# Patient Record
Sex: Male | Born: 1970 | Race: Black or African American | Hispanic: No | Marital: Single | Smoking: Former smoker
Health system: Southern US, Community
[De-identification: ages and names within clinical notes are randomized; demographics above are authoritative.]

## PROBLEM LIST (undated history)

## (undated) DIAGNOSIS — E119 Type 2 diabetes mellitus without complications: Secondary | ICD-10-CM

## (undated) DIAGNOSIS — E785 Hyperlipidemia, unspecified: Secondary | ICD-10-CM

## (undated) DIAGNOSIS — D1720 Benign lipomatous neoplasm of skin and subcutaneous tissue of unspecified limb: Secondary | ICD-10-CM

## (undated) HISTORY — PX: NASAL FRACTURE SURGERY: SHX718

---

## 2005-05-21 ENCOUNTER — Emergency Department (HOSPITAL_COMMUNITY): Admission: EM | Admit: 2005-05-21 | Discharge: 2005-05-21 | Payer: Self-pay | Admitting: Family Medicine

## 2006-04-29 ENCOUNTER — Emergency Department (HOSPITAL_COMMUNITY): Admission: EM | Admit: 2006-04-29 | Discharge: 2006-04-29 | Payer: Self-pay | Admitting: Family Medicine

## 2007-08-24 ENCOUNTER — Emergency Department (HOSPITAL_COMMUNITY): Admission: EM | Admit: 2007-08-24 | Discharge: 2007-08-24 | Payer: Self-pay | Admitting: Emergency Medicine

## 2013-09-22 ENCOUNTER — Emergency Department (HOSPITAL_COMMUNITY): Payer: No Typology Code available for payment source

## 2013-09-22 ENCOUNTER — Encounter (HOSPITAL_COMMUNITY): Payer: Self-pay | Admitting: Emergency Medicine

## 2013-09-22 ENCOUNTER — Emergency Department (INDEPENDENT_AMBULATORY_CARE_PROVIDER_SITE_OTHER)
Admission: EM | Admit: 2013-09-22 | Discharge: 2013-09-22 | Disposition: A | Discharge status: Hospice | Payer: PRIVATE HEALTH INSURANCE | Source: Home / Self Care | Attending: Family Medicine | Admitting: Family Medicine

## 2013-09-22 ENCOUNTER — Emergency Department (HOSPITAL_COMMUNITY)
Admission: EM | Admit: 2013-09-22 | Discharge: 2013-09-22 | Disposition: A | Discharge status: Home / Self Care | Payer: No Typology Code available for payment source | Attending: Emergency Medicine | Admitting: Emergency Medicine

## 2013-09-22 DIAGNOSIS — Z87891 Personal history of nicotine dependence: Secondary | ICD-10-CM | POA: Insufficient documentation

## 2013-09-22 DIAGNOSIS — R079 Chest pain, unspecified: Secondary | ICD-10-CM

## 2013-09-22 DIAGNOSIS — I10 Essential (primary) hypertension: Secondary | ICD-10-CM

## 2013-09-22 LAB — CBC WITH DIFFERENTIAL/PLATELET
BASOS ABS: 0 10*3/uL (ref 0.0–0.1)
BASOS PCT: 0 % (ref 0–1)
EOS ABS: 0.1 10*3/uL (ref 0.0–0.7)
EOS PCT: 1 % (ref 0–5)
HCT: 38.6 % — ABNORMAL LOW (ref 39.0–52.0)
Hemoglobin: 13.2 g/dL (ref 13.0–17.0)
Lymphocytes Relative: 23 % (ref 12–46)
Lymphs Abs: 2.1 10*3/uL (ref 0.7–4.0)
MCH: 33.2 pg (ref 26.0–34.0)
MCHC: 34.2 g/dL (ref 30.0–36.0)
MCV: 97 fL (ref 78.0–100.0)
Monocytes Absolute: 0.7 10*3/uL (ref 0.1–1.0)
Monocytes Relative: 8 % (ref 3–12)
Neutro Abs: 6.2 10*3/uL (ref 1.7–7.7)
Neutrophils Relative %: 68 % (ref 43–77)
PLATELETS: 227 10*3/uL (ref 150–400)
RBC: 3.98 MIL/uL — ABNORMAL LOW (ref 4.22–5.81)
RDW: 11.8 % (ref 11.5–15.5)
WBC: 9.2 10*3/uL (ref 4.0–10.5)

## 2013-09-22 LAB — I-STAT TROPONIN, ED
Troponin i, poc: 0 ng/mL (ref 0.00–0.08)
Troponin i, poc: 0 ng/mL (ref 0.00–0.08)

## 2013-09-22 LAB — COMPREHENSIVE METABOLIC PANEL
ALT: 25 U/L (ref 0–53)
ANION GAP: 13 (ref 5–15)
AST: 21 U/L (ref 0–37)
Albumin: 3.6 g/dL (ref 3.5–5.2)
Alkaline Phosphatase: 67 U/L (ref 39–117)
BILIRUBIN TOTAL: 0.4 mg/dL (ref 0.3–1.2)
BUN: 10 mg/dL (ref 6–23)
CO2: 29 mEq/L (ref 19–32)
CREATININE: 1.06 mg/dL (ref 0.50–1.35)
Calcium: 9.2 mg/dL (ref 8.4–10.5)
Chloride: 100 mEq/L (ref 96–112)
GFR calc non Af Amer: 86 mL/min — ABNORMAL LOW (ref >90)
Glucose, Bld: 120 mg/dL — ABNORMAL HIGH (ref 70–99)
Potassium: 4.2 mEq/L (ref 3.7–5.3)
Sodium: 142 mEq/L (ref 137–147)
TOTAL PROTEIN: 7.5 g/dL (ref 6.0–8.3)

## 2013-09-22 LAB — D-DIMER, QUANTITATIVE (NOT AT ARMC): D DIMER QUANT: 0.44 ug{FEU}/mL (ref 0.00–0.48)

## 2013-09-22 MED ORDER — ASPIRIN 81 MG PO CHEW
CHEWABLE_TABLET | ORAL | Status: AC
  Filled 2013-09-22: qty 4

## 2013-09-22 MED ORDER — ALBUTEROL SULFATE (2.5 MG/3ML) 0.083% IN NEBU
5.0000 mg | INHALATION_SOLUTION | Freq: Once | RESPIRATORY_TRACT | Status: AC
  Administered 2013-09-22: 5 mg via RESPIRATORY_TRACT
  Filled 2013-09-22: qty 6

## 2013-09-22 MED ORDER — SODIUM CHLORIDE 0.9 % IV SOLN
Freq: Once | INTRAVENOUS | Status: AC
  Administered 2013-09-22: 18:00:00 via INTRAVENOUS

## 2013-09-22 MED ORDER — NITROGLYCERIN 0.4 MG SL SUBL
0.4000 mg | SUBLINGUAL_TABLET | SUBLINGUAL | Status: DC | PRN
  Administered 2013-09-22: 0.4 mg via SUBLINGUAL

## 2013-09-22 MED ORDER — MORPHINE SULFATE 4 MG/ML IJ SOLN
4.0000 mg | Freq: Once | INTRAMUSCULAR | Status: DC
  Filled 2013-09-22: qty 1

## 2013-09-22 MED ORDER — ASPIRIN 81 MG PO CHEW
324.0000 mg | CHEWABLE_TABLET | Freq: Once | ORAL | Status: AC
  Administered 2013-09-22: 324 mg via ORAL

## 2013-09-22 MED ORDER — NITROGLYCERIN 0.4 MG SL SUBL
SUBLINGUAL_TABLET | SUBLINGUAL | Status: AC
  Filled 2013-09-22: qty 1

## 2013-09-22 MED ORDER — ASPIRIN 81 MG PO CHEW
CHEWABLE_TABLET | ORAL | Status: AC
  Filled 2013-09-22: qty 1

## 2013-09-22 NOTE — ED Notes (Signed)
Pt ambulatory to bathroom with steady gait.

## 2013-09-22 NOTE — ED Notes (Signed)
Pt arrived by [facility] from [facility] with c/o CP that is in the center. CP woke pt up from a nap around 1530. Denies any nausea or dizziness. Does have some SOB with CP. UC administered ASA 324mg  and Nitro x1 then EMS administered Nitro x1. Pain has increased since first Nitro from 3/10 to 6/10. Currently pain is 5/10. NSR on monitor.

## 2013-09-22 NOTE — Discharge Instructions (Signed)
Your blood sugar is slightly elevated. Please follow up regarding this.   You may need a stress test. [facility].   Follow up with a doctor.   Return to ER if you have severe chest pain, shortness of breath.    Emergency Department Resource Guide 1) Find a Doctor and Pay Out of Pocket Although you won't have to find out who is covered by your insurance plan, it is a good idea to ask around and get recommendations. You will then need to call the office and see if the doctor you have chosen will accept you as a new patient and what types of options they offer for patients who are self-pay. Some doctors offer discounts or will set up payment plans for their patients who do not have insurance, but you will need to ask so you aren't surprised when you get to your appointment.  2) Contact Your Local Health Department Not all health departments have doctors that can see patients for sick visits, but many do, so it is worth a call to see if yours does. If you don't know where your local health department is, you can check in your phone book. The CDC also has a tool to help you locate your state's health department, and many state websites also have listings of all of their local health departments.  3) Find a [facility] If your illness is not likely to be very severe or complicated, you may want to try a walk in clinic. These are popping up all over the country in pharmacies, drugstores, and shopping centers. They're usually staffed by nurse practitioners or physician assistants that have been trained to treat common illnesses and complaints. They're usually fairly quick and inexpensive. However, if you have serious medical issues or chronic medical problems, these are probably not your best option.  No Primary Care Doctor: - Call Health Connect at  [phone] - they can help you locate a primary care doctor that  accepts your insurance, provides certain services, etc. - Physician Referral  Service- [phone]  Chronic Pain Problems: Organization         Address  Phone   Notes  [facility]  [phone] Patients need to be referred by their primary care doctor.   Medication Assistance: Organization         Address  Phone   Notes  [facility] Medication [facility] [address] [place], [place] [address] [phone] --Must be a resident of [facility] -- Must have NO insurance coverage whatsoever (no Medicaid/ Medicare, etc.) -- The pt. MUST have a primary care doctor that directs their care regularly and follows them in the community   [facility]ist  [phone]   [employer]  [phone]    Agencies that provide inexpensive medical care: Organization         Address  Phone   Notes  [place]  [phone]   [facility]    [phone]   [facility] [place], [place] [address] [phone]   [place] [address], [place] [phone]   Planned Parenthood    [phone]   [facility]    [phone]   [place] and [place] [address], [place] Phone:  [phone], Fax:  [phone] Hours of Operation:  9 am - 6 pm, M-F.  Also accepts Medicaid/Medicare and self-pay.  [facility] for [place] [address], [address], [place] Phone: [phone], Fax: [phone]. Hours of Operation:  8:30 am - 5:30 pm, M-F.  Also accepts Medicaid and self-pay.  [facility] [place] [address], [place] Phone: [phone]   [place], [place], [place] [phone], Ext. [phone] Mondays & Thursdays: 7-9 AM.  First 15 patients are seen on a first come, first serve basis.    [place] Providers:  Organization         Address  Phone   Notes  [facility] [address], [place] [phone] Also accepts self-pay patients.  [facility] [address], [place]  [phone]   [place], [address], [place] [phone]   [facility] [address] [place] [phone]   Lucianne Lei [address], [place]   [phone] Only accepts [place] Access [place] patients after they have their name applied to their card.   Self-Pay (no insurance) in [facility]:  Organization         Address  Phone   Notes  Sickle Cell Patients, [facility] [place] [phone]   [facility] [place] [phone]   [facility] [place]  [place], [place], [place] [phone]   [facility]/Dr. Osei-Bonsu  [address] [place] or [address], [place] [phone] Phone number for both [place] and [place] locations is the same.  Urgent Medical and [facility] [address] [place] [phone]   [facility] [address] [place] or [address] [phone]   [facility] [address], [place] [phone], phone; [phone], fax Sees patients 1st and 3rd Saturday of every month.  Must not qualify for public or private insurance (i.e. Medicaid, Medicare, [place] Health Choice, Veterans' Benefits)  Household income should be no more than 200% of the poverty level The clinic cannot treat you if you are pregnant or think you are pregnant  Sexually transmitted diseases are not treated at the clinic.    Dental Care: Organization         Address  Phone  Notes  [facility] Department of [facility] [place] [phone] Accepts children up to age 47 who are enrolled in  [place] or [place]; pregnant women with a Medicaid card; and children who have applied for Medicaid or [place] Health Choice, but were declined, whose parents can pay a reduced fee at time of service.  [facility] Department of [facility]  [address], [place] [phone] Accepts children up to age 94 who are enrolled in [place] or [place]; pregnant women with a Medicaid card; and children who have applied for Medicaid or [place] Health Choice, but were declined, whose parents can pay a reduced fee at time of service.  [place] Adult Dental Access PROGRAM  [place] [phone] Patients are seen by appointment only. Walk-ins are not accepted. [place] will see patients 76 years of age and older. Monday - Tuesday (  8am-5pm) Most Wednesdays (8:30-5pm) $30 per visit, cash only  [facility] Adult Dental Access PROGRAM  [address], [facility] [phone] Patients are seen by appointment only. Walk-ins are not accepted. [place] will see patients 1 years of age and older. One Wednesday Evening (Monthly: Volunteer Based).  $30 per visit, cash only  [place]  [phone] for adults; Children under age 44, call Graduate Pediatric Dentistry at [phone]. Children aged 74-14, please call [phone] to request a pediatric application.  Dental services are provided in all areas of dental care including fillings, crowns and bridges, complete and partial dentures, implants, gum treatment, root canals, and extractions. Preventive care is also provided. Treatment is provided to both adults and children. Patients are selected via a lottery and there is often a waiting list.   [facility] [address] [place]  [phone] [email or web address]   [facility] [address] [place], [place] [phone], Ext. [phone] Second and Fourth Thursday of each month, opens at 6:30  AM; Clinic ends at 9 AM.  Patients are seen on a first-come first-served basis, and a limited number are seen during each clinic.   [facility]  [address] [place]ard [place], [place] [phone]   Eligibility Requirements You must have lived in [place], [place], or [place] counties for at least the last three months.   You cannot be eligible for state or federal sponsored [employer], including [employer], [place], or [employer].   You generally cannot be eligible for healthcare insurance through your employer.    How to apply: Eligibility screenings are held every Tuesday and Wednesday afternoon from 1:00 pm until 4:00 pm. You do not need an appointment for the interview!  [facility] [address] [place], [place], [place]   [place]  [place] Department  [place]  [phone]    Behavioral Health Resources in the Community: Intensive Outpatient Programs Organization         Address  Phone  Notes  [place]. [address], [place], [place] [phone]   [facility] Outpatient [address], [place], [place]   ADS: Alcohol & Drug Svcs [address] [place], [place]   [place] [address],  [place], [place] or [phone]   Substance Abuse Resources Organization         Address  Phone  Notes  Alcohol and Drug Services  [phone]   [place]  [phone]   [place]   Chinita Pester  [phone]   Residential & Outpatient Substance Abuse Program  [phone]   Psychological Services Organization         Address  Phone  Notes  [facility] [place]  [place]  [phone]   [place] [address] [place] or  [phone]    Mobile Crisis Teams Organization         Address  Phone  Notes  [facility], Mobile Crisis Care Unit  [phone]   [facility] Psych[facility] Services  [address] [place], [place]   [place] [address] [place] [phone]    Self-Help/Support Groups Organization         Address  Phone  Notes  Mental Health Assoc. of [place] - variety of support groups  [place] Call for more information  Narcotics Anonymous (NA), [facility] [address], [employer] [place]  2 meetings at this location   Special educational needs teacher         Address  Phone  Notes  [facility]idential [facility] [place],    [place]  [phone]   [facility]  [address] [place] [phone], [place], [place]   [place], [place] [phone] Admissions: 8am-3pm M-F  Incentives Substance [place] [address]    [place], [place] [phone]   [facility] [address] [place], [place], [place]   [facility] [address],  [place], [place]   [facility] Programs - Intensive Outpatient [address], [place], [place]   [facility] ([place] [address]  [place], [place] [phone] or [phone]   [facility]idential Treatment Services (RTS) [address] [place], [place] Accepts Medicaid  [facility] [place] [address],  [place] [phone] Substance Abuse/Addiction Treatment   [facility] Organization         Address  Phone  Notes  [facility]  [phone]   Domenic Schwab, PhD [address] Arlis [place], [place]   [phone] or [phone]   [place] and [place], [place] [phone]   [facility] [address],  [place], [place] [phone] Insurance/Medicaid/sponsorship through [facility] and Families [address], [address] [place]                                    [place], [place] [phone] [place] [address] [place], [place] [phone]    Dr. Adele Schilder  [phone]   [facility] Dept. 1) [address], [place] 2) [place] 3)  [place] [address], [place] [phone]  [phone]   [place] [phone] or [phone] (After Hours)

## 2013-09-22 NOTE — ED Provider Notes (Signed)
Allen Weber is a 43 y.o. male who presents to Urgent Care today for chest pain. Patient developed acute onset of central sharp exertional chest pain. This occurred about 3 hours ago. This is also associated with mild shortness of breath. He denies any palpitations lightheadedness or dizziness. He additionally notes a large mass in his left axilla that has been present for 4-5 years. He denies any medical problems but does not go to doctors.   History reviewed. No pertinent past medical history. History  Substance Use Topics  . Smoking status: Not on file  . Smokeless tobacco: Not on file  . Alcohol Use: Not on file   ROS as above Medications: Current Facility-Administered Medications  Medication Dose Route Frequency Provider Last Rate Last Dose  . 0.9 %  sodium chloride infusion   Intravenous Once Gregor Hams, MD      . aspirin chewable tablet 324 mg  324 mg Oral Once Gregor Hams, MD      . nitroGLYCERIN (NITROSTAT) SL tablet 0.4 mg  0.4 mg Sublingual Q5 min PRN Gregor Hams, MD       No current outpatient prescriptions on file.    Exam:  BP 172/70  Pulse 105  Temp(Src) 98.4 F (36.9 C) (Oral)  Resp 18  SpO2 95% Gen: Well NAD HEENT: EOMI,  MMM Lungs: Normal work of breathing. CTABL Heart: Tachycardia but regular no MRG Abd: NABS, Soft. NT, ND Exts: Brisk capillary refill, warm and well perfused.  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 T wave flattening of the lateral precordial leads associated with 1 mm ST elevation of leads V 5 and 6 as well as aVF. This may reflect early repolarization with increased J-point elevation.  No results found for this or any previous visit (from the past 24 hour(s)). No results found.  Assessment and Plan: 43 y.o. male with acute onset of central exertional chest pain with associated mild EKG abnormalities. Additionally patient  has  decreased oxygen saturation and tachycardia. This is concerning for acute coronary syndrome or pulmonary embolism.  Plan to provide oxygen nitroglycerin aspirin and started IV. Transfer to the emergency room via EMS.  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  Discussed warning signs or symptoms. Please see discharge instructions. Patient expresses understanding.    Gregor Hams, MD 09/22/13 [phone]

## 2013-09-22 NOTE — ED Provider Notes (Signed)
CSN: [id number]     Arrival date & time 09/22/13  1820 History   First MD Initiated Contact with Patient 09/22/13 1856     Chief Complaint  Patient presents with  . Chest Pain     (Consider location/radiation/quality/duration/timing/severity/associated sxs/prior Treatment) The history is provided by the patient.  Allen Weber is a 43 y.o. male here with chest pain. Hasn't seen a doctor for several years. Woke up around 3:30 pm with chest pain. Substernal chest pain, mild shortness of breath. He said that it is sometimes worse with deep breathing and sometimes exertional but not all the time. No hx of CAD. Also has L axillary mass that is chronic and not draining. Went to urgent care, sent for evaluation of chest pain. 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    History reviewed. No pertinent past medical history. History reviewed. No pertinent past surgical history. No family history on file. History  Substance Use Topics  . Smoking status: Former Smoker    Types: Cigarettes    Quit date: 07/23/2013  . Smokeless tobacco: Not on file  . Alcohol Use: Yes     Comment: occasionally    Review of Systems  Cardiovascular: Positive for chest pain.  All other systems reviewed and are negative.     Allergies  Review of patient's allergies indicates no known allergies.  Home Medications   Prior to Admission medications   Medication Sig Start Date End Date Taking? Authorizing Provider  ibuprofen (ADVIL,MOTRIN) 200 MG tablet Take 400 mg by mouth every 6 (six) hours as needed.   Yes Historical Provider, MD   BP 113/67  Pulse 86  Temp(Src) 98.6 F (37 C) (Oral)  Resp 21  Wt 204 lb 9 oz (92.789 kg)  SpO2 98% Physical Exam  Nursing note and vitals reviewed. Constitutional: He is oriented to person, place, and time. He appears well-developed and well-nourished.  Slightly uncomfortable   HENT:  Head: Normocephalic.  Mouth/Throat: Oropharynx is clear and moist.  Eyes: Conjunctivae  are normal. Pupils are equal, round, and reactive to light.  Neck: Normal range of motion. Neck supple.  Cardiovascular: Normal rate, regular rhythm and normal heart sounds.   Pulmonary/Chest: Effort normal and breath sounds normal. No respiratory distress. He has no wheezes. He has no rales. He exhibits no tenderness.  Abdominal: Soft. Bowel sounds are normal. He exhibits no distension. There is no tenderness. There is no rebound and no guarding.  Musculoskeletal: Normal range of motion. He exhibits no edema and no tenderness.  Neurological: He is alert and oriented to person, place, and time. No cranial nerve deficit. Coordination normal.  Skin: Skin is warm and dry.  L axilla with lipoma with no fluctuance   Psychiatric: He has a normal mood and affect. His behavior is normal. Judgment and thought content normal.    ED Course  Procedures (including critical care time) Labs Review Labs Reviewed  CBC WITH DIFFERENTIAL - Abnormal; Notable for the following:    RBC 3.98 (*)    HCT 38.6 (*)    All other components within normal limits  COMPREHENSIVE METABOLIC PANEL - Abnormal; Notable for the following:    Glucose, Bld 120 (*)    GFR calc non Af Amer 86 (*)    All other components within normal limits  D-DIMER, QUANTITATIVE  I-STAT TROPOININ, ED  Randolm Idol, ED    Imaging Review Dg Chest 2 View  09/22/2013   CLINICAL DATA:  Chest pain.  EXAM: CHEST  2 VIEW  COMPARISON:  None.  FINDINGS: Heart size and mediastinal contours are within normal limits. Both lungs are clear. Visualized skeletal structures are unremarkable.  IMPRESSION: Negative exam.   Electronically Signed   By: Inge Rise M.D.   On: 09/22/2013 19:35     EKG Interpretation   Date/Time:  Sunday September 22 2013 21:44:16 EDT Ventricular Rate:  80 PR Interval:  172 QRS Duration: 89 QT Interval:  346 QTC Calculation: 399 R Axis:   48 Text Interpretation:  Sinus rhythm ST elev, probable normal early repol   pattern No significant change since last tracing earlier that day   Confirmed by Lamonda Noxon  MD, Iyona Pehrson ([id number]) on 09/22/2013 9:55:41 PM      MDM   Final diagnoses:  None   Allen Weber is a 43 y.o. male here with chest pain. Consider ACS vs PE given tachycardia. Will get trop x 2 and d-dimer. Will reasess.   10:25 PM Trop neg x 2. D-dimer neg. Pain free. Repeat EKG unchanged. Will refer to Wellness for follow up.    Wandra Arthurs, MD 09/22/13 2225

## 2013-09-22 NOTE — ED Notes (Signed)
C/o mid chest pain which started today  States he has had this mass under his left arm for 4-5 years

## 2013-09-25 ENCOUNTER — Ambulatory Visit: Payer: No Typology Code available for payment source | Attending: Internal Medicine | Admitting: Internal Medicine

## 2013-09-25 VITALS — BP 133/84 | HR 80 | Temp 98.2°F | Resp 16 | Ht 74.5 in | Wt 252.0 lb

## 2013-09-25 DIAGNOSIS — I10 Essential (primary) hypertension: Secondary | ICD-10-CM

## 2013-09-25 DIAGNOSIS — R079 Chest pain, unspecified: Secondary | ICD-10-CM | POA: Diagnosis not present

## 2013-09-25 NOTE — Progress Notes (Signed)
Pt is here to establish care. Pt had a heart attack last Sunday. Pt states that he is still feeling weak. Pt has a mass under his left arm pit.

## 2013-10-05 NOTE — Progress Notes (Signed)
   Subjective:    Patient ID: Allen Weber, male    DOB: 10-Apr-1970, 43 y.o.   MRN: [id number]  HPI  Pt is 43 yo male who presents to clinic for follow up. Denies chest pain and shortness of breath, needs refill on medications.   Review of Systems Constitutional: Negative for fever, chills, diaphoresis, activity change, appetite change and fatigue.  HENT: Negative for ear pain, nosebleeds, congestion, facial swelling, rhinorrhea, neck pain, neck stiffness and ear discharge.   Eyes: Negative for pain, discharge, redness, itching and visual disturbance.  Respiratory: Negative for cough, choking, chest tightness, shortness of breath, wheezing and stridor.   Cardiovascular: Negative for chest pain, palpitations and leg swelling.  Gastrointestinal: Negative for abdominal distention.  Genitourinary: Negative for dysuria, urgency, frequency, hematuria, flank pain, decreased urine volume, difficulty urinating and dyspareunia.  Musculoskeletal: Negative for back pain, joint swelling, arthralgias and gait problem.  Neurological: Negative for dizziness, tremors, seizures, syncope, facial asymmetry, speech difficulty, weakness, light-headedness, numbness and headaches.  Hematological: Negative for adenopathy. Does not bruise/bleed easily.  Psychiatric/Behavioral: Negative for hallucinations, behavioral problems, confusion, dysphoric mood, decreased concentration and agitation.        Objective:   Physical Exam Constitutional: Appears well-developed and well-nourished. No distress.  HENT: Normocephalic. External right and left ear normal. Oropharynx is clear and moist.  Eyes: Conjunctivae and EOM are normal. PERRLA, no scleral icterus.  Neck: Normal ROM. Neck supple. No JVD. No tracheal deviation. No thyromegaly.  CVS: RRR, S1/S2 +, no murmurs, no gallops, no carotid bruit.  Pulmonary: Effort and breath sounds normal, no stridor, rhonchi, wheezes, rales.  Axilla: left axilla with mass, non TTP,  no erythema       Assessment & Plan:  HTN  - stable  - continue to monitor - pt advised to call clinic if BP > 140/90 Chest pain - resolved  - referral to cardiology for consideration of stress test  Left axillary mass - referral for surgery for further evaluation

## 2013-10-06 ENCOUNTER — Emergency Department: Payer: Self-pay | Admitting: Emergency Medicine

## 2013-10-06 LAB — URINALYSIS, COMPLETE
BACTERIA: NONE SEEN
BLOOD: NEGATIVE
Bilirubin,UR: NEGATIVE
Glucose,UR: NEGATIVE mg/dL (ref 0–75)
KETONE: NEGATIVE
Leukocyte Esterase: NEGATIVE
NITRITE: NEGATIVE
PH: 5 (ref 4.5–8.0)
Protein: NEGATIVE
RBC,UR: 1 /HPF (ref 0–5)
Specific Gravity: 1.023 (ref 1.003–1.030)
Squamous Epithelial: <1
WBC UR: 1 /HPF (ref 0–5)

## 2013-10-06 LAB — GC/CHLAMYDIA PROBE AMP

## 2013-10-15 ENCOUNTER — Encounter (INDEPENDENT_AMBULATORY_CARE_PROVIDER_SITE_OTHER): Payer: Self-pay | Admitting: General Surgery

## 2013-10-15 ENCOUNTER — Ambulatory Visit (INDEPENDENT_AMBULATORY_CARE_PROVIDER_SITE_OTHER): Payer: No Typology Code available for payment source | Admitting: General Surgery

## 2013-10-15 VITALS — BP 122/88 | HR 90 | Temp 97.3°F | Ht 74.0 in | Wt 250.0 lb

## 2013-10-15 DIAGNOSIS — D1739 Benign lipomatous neoplasm of skin and subcutaneous tissue of other sites: Secondary | ICD-10-CM

## 2013-10-15 DIAGNOSIS — D1720 Benign lipomatous neoplasm of skin and subcutaneous tissue of unspecified limb: Secondary | ICD-10-CM | POA: Insufficient documentation

## 2013-10-15 NOTE — Progress Notes (Signed)
Patient ID: Allen Weber, male   DOB: September 10, 1970, 43 y.o.   MRN: [id number]  Chief Complaint  Patient presents with  . left axillary lump    HPI Allen Weber is a 43 y.o. male.  He is referred by Dr. Mart Piggs at the [facility] for a left axillary mass.   The patient is single. He works third shift at a [employer]. He states that he has had a slowly enlarging mass in his left axilla for 5 years. It now is creating a significant mass effect and this bothers him. It has never been infected or drained. It is not tender.  Recent workup at urgent care for chest pain was negative. Hemoglobin 13.2. C-met normal. Chest x-ray normal. Otherwise healthy. On no medications. Weight has been stable.  Family history is negative for lymphoma or leukemia. Positive family history for diabetes. HPI  History reviewed. No pertinent past medical history.  History reviewed. No pertinent past surgical history.  Family History  Problem Relation Age of Onset  . Hypertension Mother   . Diabetes Maternal Grandmother   . Hypertension Maternal Grandmother   . Diabetes Paternal Grandmother   . Hypertension Paternal Grandmother     Social History History  Substance Use Topics  . Smoking status: Former Smoker    Types: Cigarettes    Quit date: 07/23/2013  . Smokeless tobacco: Not on file  . Alcohol Use: Yes     Comment: occasionally    No Known Allergies  Current Outpatient Prescriptions  Medication Sig Dispense Refill  . ibuprofen (ADVIL,MOTRIN) 200 MG tablet Take 400 mg by mouth every 6 (six) hours as needed.       No current facility-administered medications for this visit.    Review of Systems Review of Systems  Constitutional: Negative for fever, chills and unexpected weight change.  HENT: Negative for congestion, hearing loss, sore throat, trouble swallowing and voice change.   Eyes: Negative for visual disturbance.  Respiratory: Negative for  cough and wheezing.   Cardiovascular: Negative for chest pain, palpitations and leg swelling.  Gastrointestinal: Negative for nausea, vomiting, abdominal pain, diarrhea, constipation, blood in stool, abdominal distention, anal bleeding and rectal pain.  Genitourinary: Negative for hematuria and difficulty urinating.  Musculoskeletal: Negative for arthralgias.  Skin: Negative for rash and wound.  Neurological: Negative for seizures, syncope, weakness and headaches.  Hematological: Negative for adenopathy. Does not bruise/bleed easily.  Psychiatric/Behavioral: Negative for confusion.    Blood pressure 122/88, pulse 90, temperature 97.3 F (36.3 C), height _0  (1.88 m), weight 250 lb (113.399 kg).  Physical Exam Physical Exam  Constitutional: He is oriented to person, place, and time. He appears well-developed and well-nourished. No distress.  HENT:  Head: Normocephalic.  Nose: Nose normal.  Mouth/Throat: No oropharyngeal exudate.  Eyes: Conjunctivae and EOM are normal. Pupils are equal, round, and reactive to light. Right eye exhibits no discharge. Left eye exhibits no discharge. No scleral icterus.  Neck: Normal range of motion. Neck supple. No JVD present. No tracheal deviation present. No thyromegaly present.  Cardiovascular: Normal rate, regular rhythm, normal heart sounds and intact distal pulses.   No murmur heard. Pulmonary/Chest: Effort normal and breath sounds normal. No stridor. No respiratory distress. He has no wheezes. He has no rales. He exhibits no tenderness.  Right axilla normal. Left axilla reveals a large protuberant, semi-pedunculated mass low in the axilla. This feels like a very large lipoma, about 8 cm x 4 cm in  size. Nontender. No erythema. Less likely ectopic breast tissue. No axillary adenopathy.  Abdominal: Soft. Bowel sounds are normal. He exhibits no distension and no mass. There is no tenderness. There is no rebound and no guarding.  Genitourinary:  No  inguinal adenopathy.  Musculoskeletal: Normal range of motion. He exhibits no edema and no tenderness.  Lymphadenopathy:    He has no cervical adenopathy.  Neurological: He is alert and oriented to person, place, and time. He has normal reflexes. Coordination normal.  Skin: Skin is warm and dry. No rash noted. He is not diaphoretic. No erythema. No pallor.  Psychiatric: He has a normal mood and affect. His behavior is normal. Judgment and thought content normal.  Pleasant and cooperative. Very flattened affect.    Data Reviewed Office notes from [facility]. Lab work from [facility].  Assessment    8 cm pedunculated left axillary mass. Lipoma likely. Ectopic breast tissue less likely. Solid, not cystic. Doubt pathologic adenopathy     Plan    I discussed the differential diagnosis with the patient. I offered to excise this. I told him that this would probably have to be excised including skin and the deeper tissues, possibly with a drain but hopefully not. We talked about back to work issues and temporary disability. Talked about risks and complications.  At the end of the discussion he wanted to go home and think about this. He will call me back if he decides to have this removed.        Debby Clyne M 10/15/2013, 4:38 PM

## 2013-10-15 NOTE — Patient Instructions (Signed)
There is a large, protuberant mass in your left axilla. You stated that this has been enlarging for the past 5 years  This is most likely a benign lipoma. Less likely this may be ectopic breast tissue. It does not feel like a cancer.  The best way to handle this is an outpatient operation to excise this area as we discussed.  You stated that you will think about this and call me back when you are ready to do the surgery.

## 2013-10-23 ENCOUNTER — Emergency Department: Payer: Self-pay | Admitting: Emergency Medicine

## 2013-10-23 ENCOUNTER — Ambulatory Visit: Payer: PRIVATE HEALTH INSURANCE

## 2013-11-04 ENCOUNTER — Institutional Professional Consult (permissible substitution): Payer: PRIVATE HEALTH INSURANCE | Admitting: Cardiovascular Disease

## 2013-12-04 ENCOUNTER — Institutional Professional Consult (permissible substitution): Payer: PRIVATE HEALTH INSURANCE | Admitting: Cardiovascular Disease

## 2014-01-09 ENCOUNTER — Institutional Professional Consult (permissible substitution): Payer: PRIVATE HEALTH INSURANCE | Admitting: Cardiovascular Disease

## 2015-09-17 IMAGING — CR DG CHEST 2V
2 series · 2 of 2 positions shown · non-contrast
Comparison: None.

CLINICAL DATA: Chest pain.

EXAM:
CHEST  2 VIEW

[w chest pa]
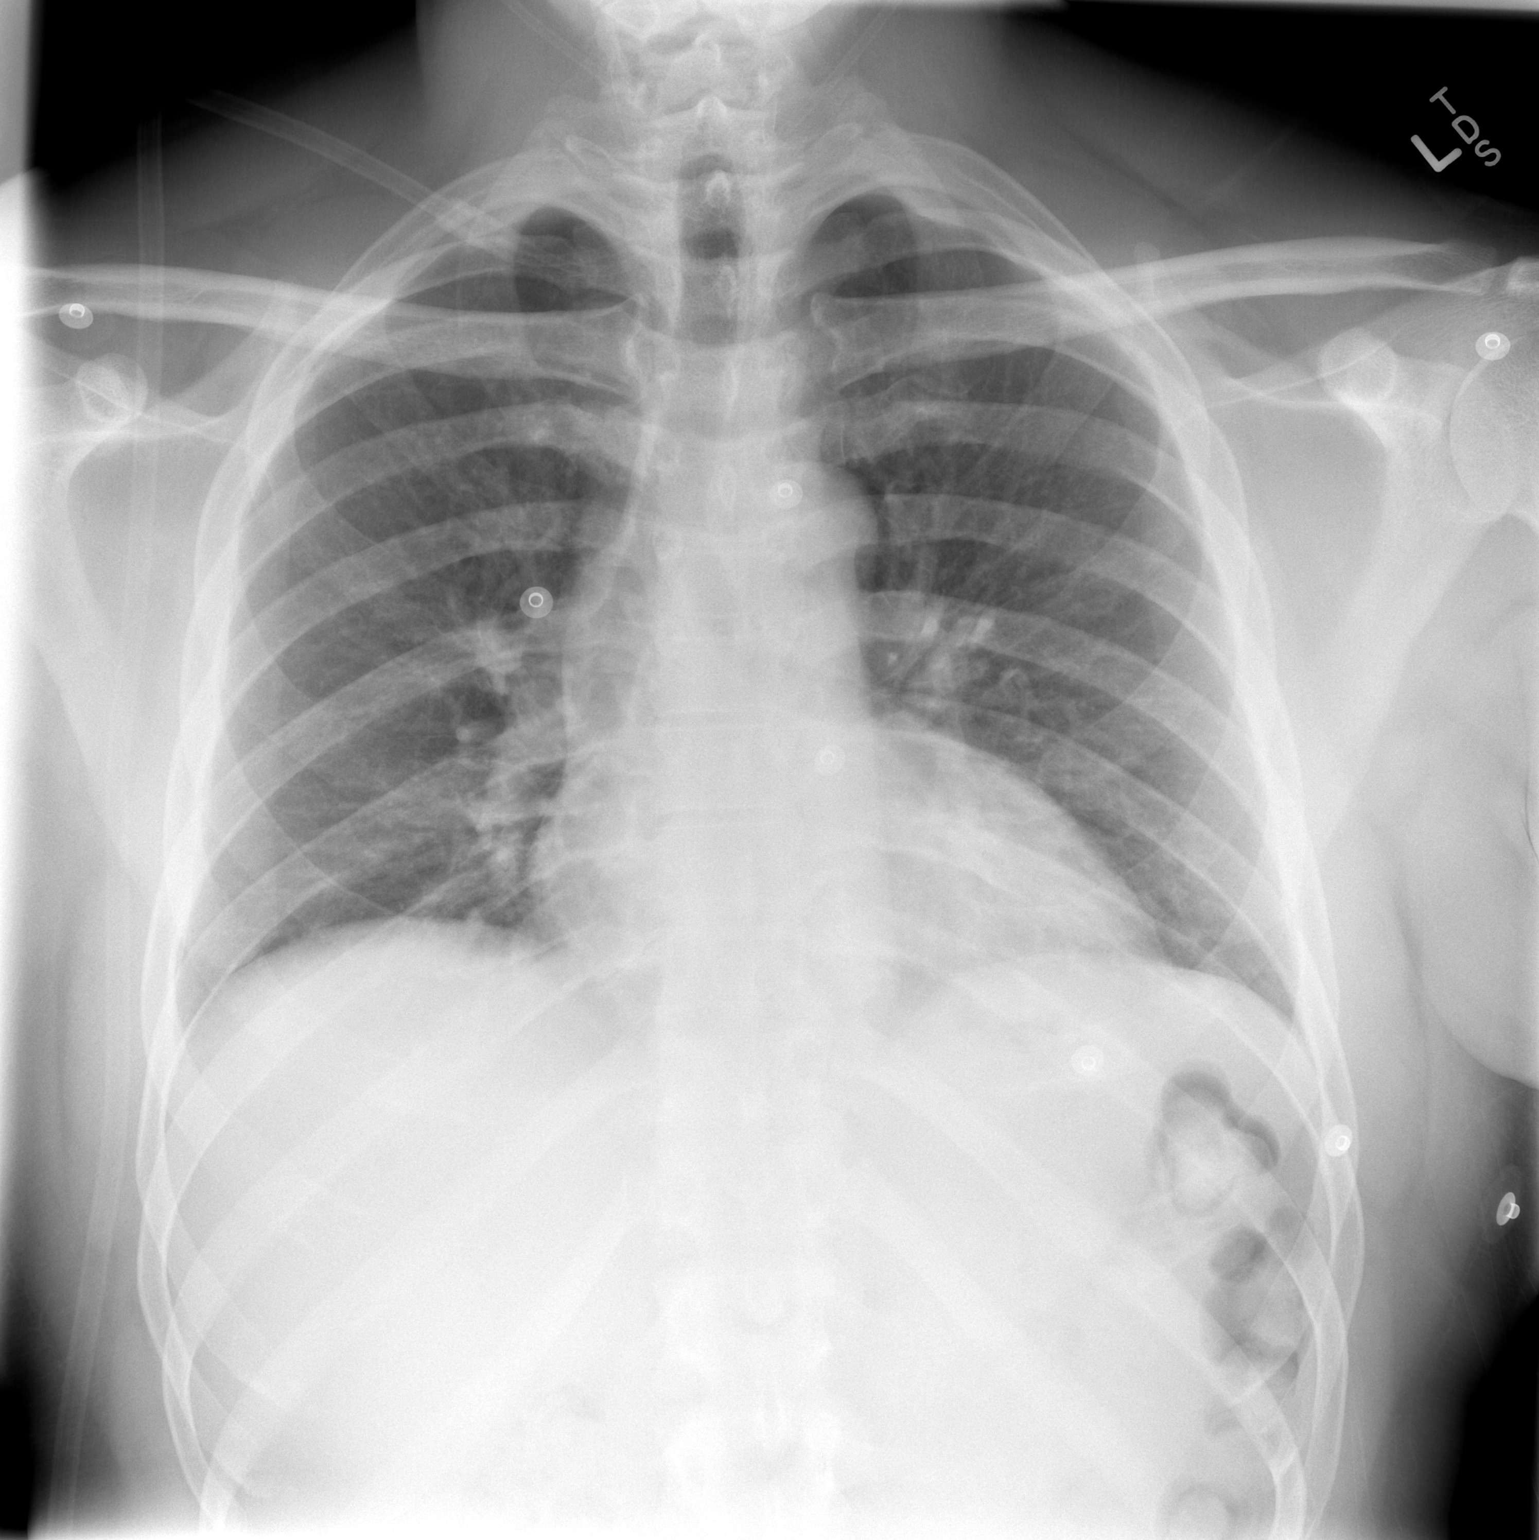

[w chest lat]
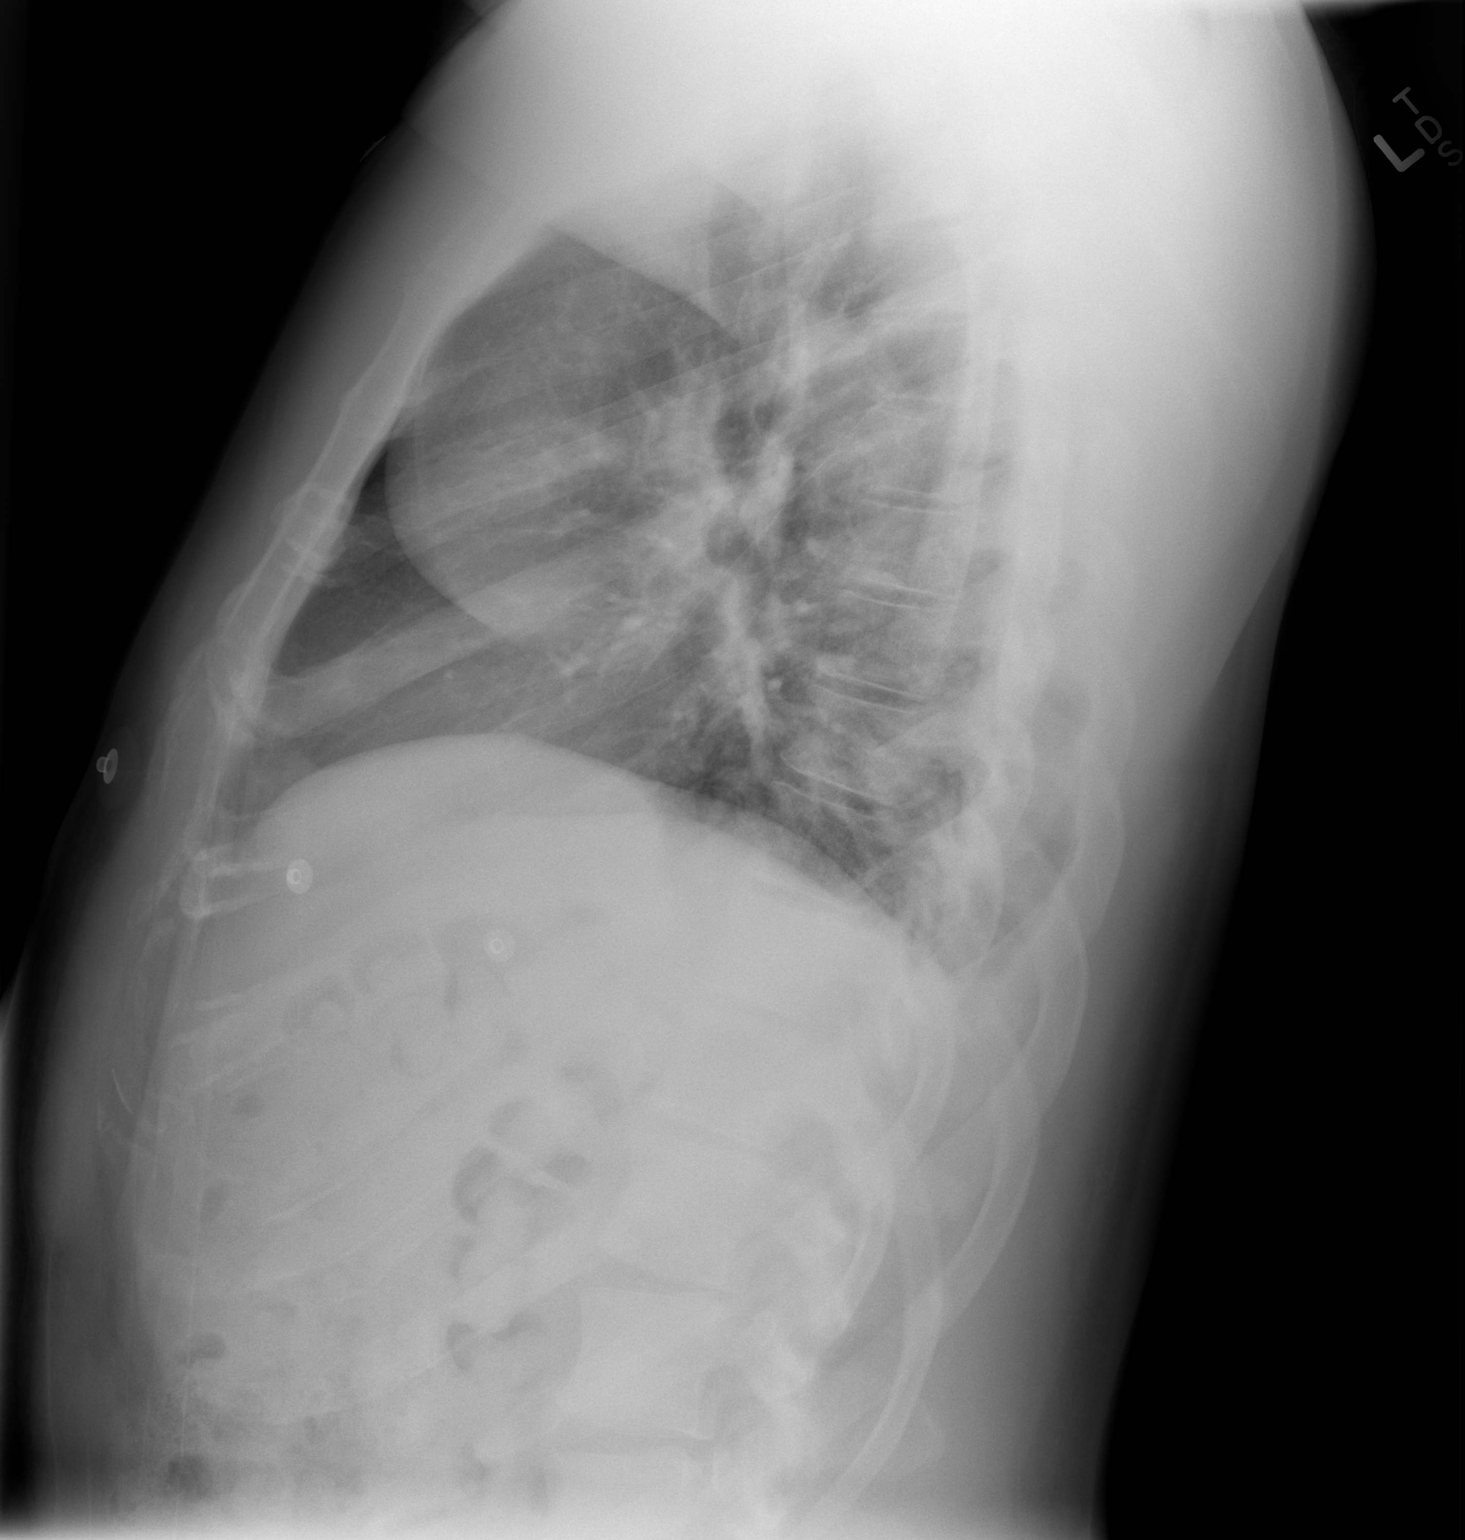

[2 of 2 positions shown; findings below may reference images not displayed]

FINDINGS: Heart size and mediastinal contours are within normal limits. Both
lungs are clear. Visualized skeletal structures are unremarkable.
IMPRESSION: Negative exam.

## 2017-01-30 ENCOUNTER — Ambulatory Visit: Payer: Self-pay | Admitting: Surgery

## 2017-01-30 NOTE — H&P (Signed)
Allen Weber 01/30/2017 10:43 AM Location: [facility] Patient #: [id number] DOB: September 04, 1970 Single / Language: Allen Weber / Race: Black or African American Male  History of Present Illness Allen Moores A. Iley Breeden MD; 01/30/2017 12:21 PM) Patient words: Patient sent at the request of Allen Weber for large left axillary mass. An present there for at least 10 years to patient states. It is slowly growing. He has recently obtained insurance and desires excision of this large mass is getting in the way and affecting his quality of life. There is been no redness or drainage. He denies arm numbness or weakness in the left side.  The patient is a 46 year old male.   Diagnostic Studies History (Allen Weber, [place]; 01/30/2017 10:43 AM) Colonoscopy never  Allergies (Allen Weber; 01/30/2017 10:44 AM) No Known Drug Allergies 01/30/2017 Allergies Reconciled  Medication History (Allen Weber; 01/30/2017 10:44 AM) No Current Medications Medications Reconciled  Social History (Allen Weber; 01/30/2017 10:43 AM) Alcohol use Occasional alcohol use. Caffeine use Tea. No drug use Tobacco use Former smoker.  Family History (Allen Weber; 01/30/2017 10:43 AM) Alcohol Abuse Allen Weber. Arthritis Family Members In General, Allen Weber. Cerebrovascular Accident Family Members In General, Allen Weber. Colon Cancer Family Members In General. Colon Polyps Family Members In General. Depression Family Members In General, Allen Weber. Diabetes Mellitus Family Members In General. Heart Disease Family Members In General. Hypertension Family Members In General, Allen Weber, Allen Weber. Kidney Disease Family Members In General.  Other Problems (Allen Weber; 01/30/2017 10:43 AM) Diabetes Mellitus     Review of Systems (Allen Weber; 01/30/2017 10:43 AM) General Present- Fatigue. Not Present- Appetite Loss, Chills, Fever, Night Sweats, Weight  Gain and Weight Loss. Skin Not Present- Change in Wart/Mole, Dryness, Hives, Jaundice, New Lesions, Non-Healing Wounds, Rash and Ulcer. HEENT Present- Seasonal Allergies. Not Present- Earache, Hearing Loss, Hoarseness, Nose Bleed, Oral Ulcers, Ringing in the Ears, Sinus Pain, Sore Throat, Visual Disturbances, Wears glasses/contact lenses and Yellow Eyes. Respiratory Present- Snoring. Not Present- Bloody sputum, Chronic Cough, Difficulty Breathing and Wheezing. Breast Not Present- Breast Mass, Breast Pain, Nipple Discharge and Skin Changes. Cardiovascular Not Present- Chest Pain, Difficulty Breathing Lying Down, Leg Cramps, Palpitations, Rapid Heart Rate, Shortness of Breath and Swelling of Extremities. Gastrointestinal Not Present- Abdominal Pain, Bloating, Bloody Stool, Change in Bowel Habits, Chronic diarrhea, Constipation, Difficulty Swallowing, Excessive gas, Gets full quickly at meals, Hemorrhoids, Indigestion, Nausea, Rectal Pain and Vomiting. Male Genitourinary Present- Nocturia. Not Present- Blood in Urine, Change in Urinary Stream, Frequency, Impotence, Painful Urination, Urgency and Urine Leakage.  Vitals (Allen Weber; 01/30/2017 10:44 AM) 01/30/2017 10:44 AM Weight: 237 lb Height: 74in Body Surface Area: 2.34 m Body Mass Index: 30.43 kg/m  Temp.: 98.18F  Pulse: 100 (Regular)  BP: 142/84 (Sitting, Left Arm, Standard)      Physical Exam (Allen Dames A. Arlan Birks MD; 01/30/2017 12:21 PM)  General Mental Status-Alert. General Appearance-Consistent with stated age. Hydration-Well hydrated. Voice-Normal.  Integumentary Note: Large 15 cm he densely mass left axilla consistent with lipoma. Mobile.  Head and Neck Head-normocephalic, atraumatic with no lesions or palpable masses. Trachea-midline. Thyroid Gland Characteristics - normal size and consistency.  Chest and Lung Exam Chest and lung exam reveals -quiet, even and easy respiratory effort  with no use of accessory muscles and on auscultation, normal breath sounds, no adventitious sounds and normal vocal resonance. Inspection Chest Wall - Normal. Back - normal.  Cardiovascular Cardiovascular examination reveals -normal heart sounds, regular  rate and rhythm with no murmurs and normal pedal pulses bilaterally.  Neurologic Neurologic evaluation reveals -alert and oriented x 3 with no impairment of recent or remote memory. Mental Status-Normal.  Musculoskeletal Normal Exam - Left-Upper Extremity Strength Normal and Lower Extremity Strength Normal. Normal Exam - Right-Upper Extremity Strength Normal and Lower Extremity Strength Normal.  Lymphatic Axillary  General Axillary Region: Bilateral - Description - Normal. Tenderness - Non Tender.    Assessment & Plan (Allen Scobey A. 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  Risk of bleeding, infection, nerve injury, blood vessel injury, anesthesia risks, cardiovascular events, cosmesis, and recurrence were discussed. He agrees to proceed.  Current Plans The pathophysiology of skin & subcutaneous masses was discussed. Natural history risks without surgery were discussed. I recommended surgery to remove the mass. I explained the technique of removal with use of local anesthesia & possible need for more aggressive sedation/anesthesia for patient comfort.  Risks such as bleeding, infection, wound breakdown, heart attack, death, and other risks were discussed. I noted a good likelihood this will help address the problem. Possibility that this will not correct all symptoms was explained. Possibility of regrowth/recurrence of the mass was discussed. We will work to minimize complications. Questions were answered. The patient expresses understanding & wishes to proceed with surgery.  Pt Education - CCS - General recommendations Pt  Education - CCS Free Text Education/Instructions: discussed with patient and provided information.

## 2017-04-26 ENCOUNTER — Encounter (HOSPITAL_BASED_OUTPATIENT_CLINIC_OR_DEPARTMENT_OTHER): Payer: Self-pay | Admitting: *Deleted

## 2017-04-26 ENCOUNTER — Other Ambulatory Visit: Payer: Self-pay

## 2017-04-26 NOTE — Progress Notes (Signed)
Patient states that he works daily from 7-330pm and does not have time off from work to come and get pre-op labs and to pick up Ensure. I asked him to arrive 2hrs early for surgery to do BMET and EKG.

## 2017-05-02 NOTE — H&P (Signed)
Allen Weber 01/30/2017 10:43 AM Location[facility] Patient #: [id number] DOB: [birth year] Single / Language: Allen Weber / Race: Black or African American Male  History of Present Illness Allen Weber A. Allen Stansbury MD; 01/30/2017 12:21 PM) Patient words: Patient sent at the request of Dr. Osborne Weber for large left axillary mass. An present there for at least 10 years to patient states. It is slowly growing. He has recently obtained insurance and desires excision of this large mass is getting in the way and affecting his quality of life. There is been no redness or drainage. He denies arm numbness or weakness in the left side.  The patient is a 47 year old male.   Diagnostic Studies History (Allen Weber, Allen Weber; 01/30/2017 10:43 AM) Colonoscopy never  Allergies (Allen Weber, Allen Weber; 01/30/2017 10:44 AM) No Known Drug Allergies 01/30/2017 Allergies Reconciled  Medication History (Allen Weber, [place]; 01/30/2017 10:44 AM) No Current Medications Medications Reconciled  Social History (Allen Weber, Allen Weber; 01/30/2017 10:43 AM) Alcohol use Occasional alcohol use. Caffeine use Tea. No drug use Tobacco use Former smoker.  Family History (Allen Weber, Allen Weber; 01/30/2017 10:43 AM) Alcohol Abuse Father. Arthritis Family Members In General, Mother. Cerebrovascular Accident Family Members In General, Father. Colon Cancer Family Members In General. Colon Polyps Family Members In General. Depression Family Members In General, Mother. Diabetes Mellitus Family Members In General. Heart Disease Family Members In General. Hypertension Family Members In General, Father, Mother. Kidney Disease Family Members In General.  Other Problems (Allen Weber, Allen Weber; 01/30/2017 10:43 AM) Diabetes Mellitus     Review of Systems (Allen Weber; 01/30/2017 10:43 AM) General Present- Fatigue. Not Present- Appetite Loss, Chills, Fever, Night  Sweats, Weight Gain and Weight Loss. Skin Not Present- Change in Wart/Mole, Dryness, Hives, Jaundice, New Lesions, Non-Healing Wounds, Rash and Ulcer. HEENT Present- Seasonal Allergies. Not Present- Earache, Hearing Loss, Hoarseness, Nose Bleed, Oral Ulcers, Ringing in the Ears, Sinus Pain, Sore Throat, Visual Disturbances, Wears glasses/contact lenses and Yellow Eyes. Respiratory Present- Snoring. Not Present- Bloody sputum, Chronic Cough, Difficulty Breathing and Wheezing. Breast Not Present- Breast Mass, Breast Pain, Nipple Discharge and Skin Changes. Cardiovascular Not Present- Chest Pain, Difficulty Breathing Lying Down, Leg Cramps, Palpitations, Rapid Heart Rate, Shortness of Breath and Swelling of Extremities. Gastrointestinal Not Present- Abdominal Pain, Bloating, Bloody Stool, Change in Bowel Habits, Chronic diarrhea, Constipation, Difficulty Swallowing, Excessive gas, Gets full quickly at meals, Hemorrhoids, Indigestion, Nausea, Rectal Pain and Vomiting. Male Genitourinary Present- Nocturia. Not Present- Blood in Urine, Change in Urinary Stream, Frequency, Impotence, Painful Urination, Urgency and Urine Leakage.  Vitals (Allen Weber; 01/30/2017 10:44 AM) 01/30/2017 10:44 AM Weight: 237 lb Height: 74in Body Surface Area: 2.34 m Body Mass Index: 30.43 kg/m  Temp.: 98.18F  Pulse: 100 (Regular)  BP: 142/84 (Sitting, Left Arm, Standard)      Physical Exam (Allen Hable A. Ryelynn Guedea MD; 01/30/2017 12:21 PM)  General Mental Status-Alert. General Appearance-Consistent with stated age. Hydration-Well hydrated. Voice-Normal.  Integumentary Note: Large 15 cm he densely mass left axilla consistent with lipoma. Mobile.  Head and Neck Head-normocephalic, atraumatic with no lesions or palpable masses. Trachea-midline. Thyroid Gland Characteristics - normal size and consistency.  Chest and Lung Exam Chest and lung exam reveals -quiet, even and  easy respiratory effort with no use of accessory muscles and on auscultation, normal breath sounds, no adventitious sounds and normal vocal resonance. Inspection Chest Wall - Normal. Back - normal.  Cardiovascular Cardiovascular examination reveals -normal heart sounds, regular  rate and rhythm with no murmurs and normal pedal pulses bilaterally.  Neurologic Neurologic evaluation reveals -alert and oriented x 3 with no impairment of recent or remote memory. Mental Status-Normal.  Musculoskeletal Normal Exam - Left-Upper Extremity Strength Normal and Lower Extremity Strength Normal. Normal Exam - Right-Upper Extremity Strength Normal and Lower Extremity Strength Normal.  Lymphatic Axillary  General Axillary Region: Bilateral - Description - Normal. Tenderness - Non Tender.    Assessment & Plan (Allen Scobie A. 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  Risk of bleeding, infection, nerve injury, blood vessel injury, anesthesia risks, cardiovascular events, cosmesis, and recurrence were discussed. He agrees to proceed.  Current Plans The pathophysiology of skin & subcutaneous masses was discussed. Natural history risks without surgery were discussed. I recommended surgery to remove the mass. I explained the technique of removal with use of local anesthesia & possible need for more aggressive sedation/anesthesia for patient comfort.  Risks such as bleeding, infection, wound breakdown, heart attack, death, and other risks were discussed. I noted a good likelihood this will help address the problem. Possibility that this will not correct all symptoms was explained. Possibility of regrowth/recurrence of the mass was discussed. We will work to minimize complications. Questions were answered. The patient expresses understanding & wishes to proceed with surgery.  Pt Education -  CCS - General recommendations Pt Education - CCS Free Text Education/Instructions: discussed with patient and provided information.

## 2017-05-03 ENCOUNTER — Encounter (HOSPITAL_BASED_OUTPATIENT_CLINIC_OR_DEPARTMENT_OTHER): Payer: Self-pay | Admitting: Certified Registered"

## 2017-05-03 ENCOUNTER — Encounter (HOSPITAL_BASED_OUTPATIENT_CLINIC_OR_DEPARTMENT_OTHER)
Admission: RE | Disposition: A | Discharge status: Home / Self Care | Payer: Self-pay | Source: Ambulatory Visit | Attending: Surgery

## 2017-05-03 ENCOUNTER — Ambulatory Visit (HOSPITAL_BASED_OUTPATIENT_CLINIC_OR_DEPARTMENT_OTHER): Payer: Self-pay | Admitting: Anesthesiology

## 2017-05-03 ENCOUNTER — Ambulatory Visit (HOSPITAL_BASED_OUTPATIENT_CLINIC_OR_DEPARTMENT_OTHER)
Admission: RE | Admit: 2017-05-03 | Discharge: 2017-05-03 | Disposition: A | Discharge status: Home / Self Care | Payer: Self-pay | Source: Ambulatory Visit | Attending: Surgery | Admitting: Surgery

## 2017-05-03 DIAGNOSIS — Z87891 Personal history of nicotine dependence: Secondary | ICD-10-CM | POA: Insufficient documentation

## 2017-05-03 DIAGNOSIS — Z7984 Long term (current) use of oral hypoglycemic drugs: Secondary | ICD-10-CM | POA: Insufficient documentation

## 2017-05-03 DIAGNOSIS — D1779 Benign lipomatous neoplasm of other sites: Secondary | ICD-10-CM | POA: Insufficient documentation

## 2017-05-03 DIAGNOSIS — E119 Type 2 diabetes mellitus without complications: Secondary | ICD-10-CM | POA: Insufficient documentation

## 2017-05-03 HISTORY — DX: Benign lipomatous neoplasm of skin and subcutaneous tissue of unspecified limb: D17.20

## 2017-05-03 HISTORY — PX: LIPOMA EXCISION: SHX5283

## 2017-05-03 HISTORY — DX: Type 2 diabetes mellitus without complications: E11.9

## 2017-05-03 LAB — POCT I-STAT, CHEM 8
BUN: 11 mg/dL (ref 6–20)
CHLORIDE: 104 mmol/L (ref 101–111)
Calcium, Ion: 1.42 mmol/L — ABNORMAL HIGH (ref 1.15–1.40)
Creatinine, Ser: 0.9 mg/dL (ref 0.61–1.24)
GLUCOSE: 118 mg/dL — AB (ref 65–99)
HEMATOCRIT: 49 % (ref 39.0–52.0)
Hemoglobin: 16.7 g/dL (ref 13.0–17.0)
POTASSIUM: 4.1 mmol/L (ref 3.5–5.1)
SODIUM: 147 mmol/L — AB (ref 135–145)
TCO2: 25 mmol/L (ref 22–32)

## 2017-05-03 LAB — GLUCOSE, CAPILLARY: GLUCOSE-CAPILLARY: 111 mg/dL — AB (ref 65–99)

## 2017-05-03 SURGERY — EXCISION LIPOMA
Anesthesia: General | Site: Axilla | Laterality: Left

## 2017-05-03 MED ORDER — CEFAZOLIN SODIUM-DEXTROSE 2-4 GM/100ML-% IV SOLN
INTRAVENOUS | Status: AC
  Filled 2017-05-03: qty 100

## 2017-05-03 MED ORDER — CHLORHEXIDINE GLUCONATE CLOTH 2 % EX PADS: 6.0000 | MEDICATED_PAD | Freq: Once | CUTANEOUS | Status: DC

## 2017-05-03 MED ORDER — LIDOCAINE 2% (20 MG/ML) 5 ML SYRINGE
INTRAMUSCULAR | Status: DC | PRN
  Administered 2017-05-03: 60 mg via INTRAVENOUS

## 2017-05-03 MED ORDER — FENTANYL CITRATE (PF) 100 MCG/2ML IJ SOLN
INTRAMUSCULAR | Status: AC
  Filled 2017-05-03: qty 2

## 2017-05-03 MED ORDER — FENTANYL CITRATE (PF) 100 MCG/2ML IJ SOLN
50.0000 ug | INTRAMUSCULAR | Status: AC | PRN
  Administered 2017-05-03 (×4): 50 ug via INTRAVENOUS

## 2017-05-03 MED ORDER — LACTATED RINGERS IV SOLN
INTRAVENOUS | Status: DC
  Administered 2017-05-03 (×2): via INTRAVENOUS

## 2017-05-03 MED ORDER — BUPIVACAINE-EPINEPHRINE (PF) 0.5% -1:200000 IJ SOLN
INTRAMUSCULAR | Status: AC
  Filled 2017-05-03: qty 30

## 2017-05-03 MED ORDER — OXYCODONE HCL 5 MG PO TABS: 5.0000 mg | ORAL_TABLET | Freq: Once | ORAL | Status: DC | PRN

## 2017-05-03 MED ORDER — IBUPROFEN 800 MG PO TABS
800.0000 mg | ORAL_TABLET | Freq: Three times a day (TID) | ORAL | 0 refills | Status: DC | PRN
Start: 2017-05-03 — End: 2020-12-31

## 2017-05-03 MED ORDER — ACETAMINOPHEN 325 MG PO TABS: 325.0000 mg | ORAL_TABLET | ORAL | Status: DC | PRN

## 2017-05-03 MED ORDER — GABAPENTIN 300 MG PO CAPS
300.0000 mg | ORAL_CAPSULE | ORAL | Status: AC
  Administered 2017-05-03: 300 mg via ORAL

## 2017-05-03 MED ORDER — MIDAZOLAM HCL 2 MG/2ML IJ SOLN
INTRAMUSCULAR | Status: AC
  Filled 2017-05-03: qty 2

## 2017-05-03 MED ORDER — CELECOXIB 200 MG PO CAPS
ORAL_CAPSULE | ORAL | Status: AC
  Filled 2017-05-03: qty 1

## 2017-05-03 MED ORDER — DEXAMETHASONE SODIUM PHOSPHATE 10 MG/ML IJ SOLN
INTRAMUSCULAR | Status: DC | PRN
  Administered 2017-05-03: 10 mg via INTRAVENOUS

## 2017-05-03 MED ORDER — OXYCODONE HCL 5 MG/5ML PO SOLN: 5.0000 mg | Freq: Once | ORAL | Status: DC | PRN

## 2017-05-03 MED ORDER — PROPOFOL 10 MG/ML IV BOLUS
INTRAVENOUS | Status: DC | PRN
  Administered 2017-05-03: 200 mg via INTRAVENOUS

## 2017-05-03 MED ORDER — OXYCODONE HCL 5 MG PO TABS: 5.0000 mg | ORAL_TABLET | ORAL | 0 refills | Status: DC | PRN

## 2017-05-03 MED ORDER — MEPERIDINE HCL 25 MG/ML IJ SOLN: 6.2500 mg | INTRAMUSCULAR | Status: DC | PRN

## 2017-05-03 MED ORDER — MIDAZOLAM HCL 2 MG/2ML IJ SOLN
1.0000 mg | INTRAMUSCULAR | Status: DC | PRN
  Administered 2017-05-03: 2 mg via INTRAVENOUS

## 2017-05-03 MED ORDER — SCOPOLAMINE 1 MG/3DAYS TD PT72: 1.0000 | MEDICATED_PATCH | Freq: Once | TRANSDERMAL | Status: DC | PRN

## 2017-05-03 MED ORDER — BUPIVACAINE-EPINEPHRINE 0.25% -1:200000 IJ SOLN
INTRAMUSCULAR | Status: AC
  Filled 2017-05-03: qty 1

## 2017-05-03 MED ORDER — 0.9 % SODIUM CHLORIDE (POUR BTL) OPTIME
TOPICAL | Status: DC | PRN
  Administered 2017-05-03: 1000 mL

## 2017-05-03 MED ORDER — ACETAMINOPHEN 160 MG/5ML PO SOLN: 325.0000 mg | ORAL | Status: DC | PRN

## 2017-05-03 MED ORDER — GABAPENTIN 300 MG PO CAPS
ORAL_CAPSULE | ORAL | Status: AC
Start: 2017-05-03 — End: 2017-05-03
  Filled 2017-05-03: qty 1

## 2017-05-03 MED ORDER — KETOROLAC TROMETHAMINE 30 MG/ML IJ SOLN: 30.0000 mg | Freq: Once | INTRAMUSCULAR | Status: DC | PRN

## 2017-05-03 MED ORDER — FENTANYL CITRATE (PF) 100 MCG/2ML IJ SOLN
25.0000 ug | INTRAMUSCULAR | Status: DC | PRN
  Administered 2017-05-03 (×2): 50 ug via INTRAVENOUS

## 2017-05-03 MED ORDER — ACETAMINOPHEN 500 MG PO TABS
ORAL_TABLET | ORAL | Status: AC
  Filled 2017-05-03: qty 2

## 2017-05-03 MED ORDER — ACETAMINOPHEN 500 MG PO TABS
1000.0000 mg | ORAL_TABLET | ORAL | Status: AC
  Administered 2017-05-03: 1000 mg via ORAL

## 2017-05-03 MED ORDER — BUPIVACAINE-EPINEPHRINE 0.5% -1:200000 IJ SOLN
INTRAMUSCULAR | Status: DC | PRN
  Administered 2017-05-03: 10 mL
  Administered 2017-05-03: 20 mL

## 2017-05-03 MED ORDER — ONDANSETRON HCL 4 MG/2ML IJ SOLN
INTRAMUSCULAR | Status: DC | PRN
  Administered 2017-05-03: 4 mg via INTRAVENOUS

## 2017-05-03 MED ORDER — ONDANSETRON HCL 4 MG/2ML IJ SOLN: 4.0000 mg | Freq: Once | INTRAMUSCULAR | Status: DC | PRN

## 2017-05-03 MED ORDER — CELECOXIB 200 MG PO CAPS
200.0000 mg | ORAL_CAPSULE | ORAL | Status: AC
  Administered 2017-05-03: 200 mg via ORAL

## 2017-05-03 MED ORDER — CEFAZOLIN SODIUM-DEXTROSE 2-4 GM/100ML-% IV SOLN
2.0000 g | INTRAVENOUS | Status: AC
  Administered 2017-05-03: 2 g via INTRAVENOUS

## 2017-05-03 SURGICAL SUPPLY — 59 items
APPLIER CLIP 9.375 MED OPEN (MISCELLANEOUS) ×3
BANDAGE ACE 6X5 VEL STRL LF (GAUZE/BANDAGES/DRESSINGS) ×3 IMPLANT
BENZOIN TINCTURE PRP APPL 2/3 (GAUZE/BANDAGES/DRESSINGS) IMPLANT
BIOPATCH RED 1 DISK 7.0 (GAUZE/BANDAGES/DRESSINGS) ×2 IMPLANT
BIOPATCH RED 1IN DISK 7.0MM (GAUZE/BANDAGES/DRESSINGS) ×1
BLADE SURG 10 STRL SS (BLADE) IMPLANT
BLADE SURG 15 STRL LF DISP TIS (BLADE) ×1 IMPLANT
BLADE SURG 15 STRL SS (BLADE) ×2
CANISTER SUCT 1200ML W/VALVE (MISCELLANEOUS) ×3 IMPLANT
CHLORAPREP W/TINT 26ML (MISCELLANEOUS) ×3 IMPLANT
CLIP APPLIE 9.375 MED OPEN (MISCELLANEOUS) ×1 IMPLANT
CLOSURE WOUND 1/2 X4 (GAUZE/BANDAGES/DRESSINGS)
COVER BACK TABLE 60X90IN (DRAPES) ×3 IMPLANT
COVER MAYO STAND STRL (DRAPES) ×3 IMPLANT
DECANTER SPIKE VIAL GLASS SM (MISCELLANEOUS) IMPLANT
DERMABOND ADVANCED (GAUZE/BANDAGES/DRESSINGS) ×2
DERMABOND ADVANCED .7 DNX12 (GAUZE/BANDAGES/DRESSINGS) ×1 IMPLANT
DRAIN CHANNEL 19F RND (DRAIN) ×3 IMPLANT
DRAPE LAPAROTOMY 100X72 PEDS (DRAPES) ×3 IMPLANT
DRAPE UTILITY XL STRL (DRAPES) ×3 IMPLANT
DRSG PAD ABDOMINAL 8X10 ST (GAUZE/BANDAGES/DRESSINGS) ×3 IMPLANT
DRSG TEGADERM 2-3/8X2-3/4 SM (GAUZE/BANDAGES/DRESSINGS) ×3 IMPLANT
ELECT COATED BLADE 2.86 ST (ELECTRODE) ×3 IMPLANT
ELECT REM PT RETURN 9FT ADLT (ELECTROSURGICAL) ×3
ELECTRODE REM PT RTRN 9FT ADLT (ELECTROSURGICAL) ×1 IMPLANT
EVACUATOR SILICONE 100CC (DRAIN) ×3 IMPLANT
GLOVE BIO SURGEON STRL SZ 6.5 (GLOVE) ×2 IMPLANT
GLOVE BIO SURGEONS STRL SZ 6.5 (GLOVE) ×1
GLOVE BIOGEL PI IND STRL 7.0 (GLOVE) ×1 IMPLANT
GLOVE BIOGEL PI IND STRL 8 (GLOVE) ×1 IMPLANT
GLOVE BIOGEL PI INDICATOR 7.0 (GLOVE) ×2
GLOVE BIOGEL PI INDICATOR 8 (GLOVE) ×2
GLOVE ECLIPSE 6.5 STRL STRAW (GLOVE) ×6 IMPLANT
GLOVE ECLIPSE 8.0 STRL XLNG CF (GLOVE) ×3 IMPLANT
GOWN STRL REUS W/ TWL LRG LVL3 (GOWN DISPOSABLE) ×3 IMPLANT
GOWN STRL REUS W/TWL LRG LVL3 (GOWN DISPOSABLE) ×6
NEEDLE HYPO 25X1 1.5 SAFETY (NEEDLE) ×3 IMPLANT
NS IRRIG 1000ML POUR BTL (IV SOLUTION) ×3 IMPLANT
PACK BASIN DAY SURGERY FS (CUSTOM PROCEDURE TRAY) ×3 IMPLANT
PENCIL BUTTON HOLSTER BLD 10FT (ELECTRODE) ×3 IMPLANT
SHEET MEDIUM DRAPE 40X70 STRL (DRAPES) ×3 IMPLANT
SLEEVE SCD COMPRESS KNEE MED (MISCELLANEOUS) ×3 IMPLANT
SPONGE LAP 18X18 X RAY DECT (DISPOSABLE) ×3 IMPLANT
SPONGE LAP 4X18 X RAY DECT (DISPOSABLE) ×3 IMPLANT
STAPLER VISISTAT 35W (STAPLE) IMPLANT
STRIP CLOSURE SKIN 1/2X4 (GAUZE/BANDAGES/DRESSINGS) IMPLANT
SUT ETHILON 2 0 FS 18 (SUTURE) ×3 IMPLANT
SUT MON AB 4-0 PC3 18 (SUTURE) ×9 IMPLANT
SUT VIC AB 0 CT1 18XCR BRD 8 (SUTURE) ×3 IMPLANT
SUT VIC AB 0 CT1 8-18 (SUTURE) ×9
SUT VIC AB 2-0 SH 18 (SUTURE) ×3 IMPLANT
SUT VICRYL 3-0 CR8 SH (SUTURE) IMPLANT
SUT VICRYL AB 3 0 TIES (SUTURE) IMPLANT
SYR CONTROL 10ML LL (SYRINGE) ×3 IMPLANT
TOWEL OR 17X24 6PK STRL BLUE (TOWEL DISPOSABLE) ×3 IMPLANT
TOWEL OR NON WOVEN STRL DISP B (DISPOSABLE) IMPLANT
TUBE CONNECTING 20'X1/4 (TUBING) ×1
TUBE CONNECTING 20X1/4 (TUBING) ×2 IMPLANT
YANKAUER SUCT BULB TIP NO VENT (SUCTIONS) ×3 IMPLANT

## 2017-05-03 NOTE — Anesthesia Procedure Notes (Signed)
Procedure Name: LMA Insertion Date/Time: 05/03/2017 12:05 PM Performed by: Signe Colt, CRNA Pre-anesthesia Checklist: Patient identified, Emergency Drugs available, Suction available and Patient being monitored Patient Re-evaluated:Patient Re-evaluated prior to induction Oxygen Delivery Method: Circle system utilized Preoxygenation: Pre-oxygenation with 100% oxygen Induction Type: IV induction Ventilation: Mask ventilation without difficulty LMA: LMA inserted LMA Size: 5.0 Number of attempts: 1 Airway Equipment and Method: Bite block Placement Confirmation: positive ETCO2 Tube secured with: Tape Dental Injury: Teeth and Oropharynx as per pre-operative assessment

## 2017-05-03 NOTE — H&P (Signed)
Allen Weber  Location: [facility] Patient #: [id number] DOB: 31-Jul-1970 Single / Language: [name] / Race: Black or African American Male  History of Present Illness  Patient words: Patient sent at the request of Dr. Osborne Casco for large left axillary mass. An present there for at least 10 years to patient states. It is slowly growing. He has recently obtained insurance and desires excision of this large mass is getting in the way and affecting his quality of life. There is been no redness or drainage. He denies arm numbness or weakness in the left side.  The patient is a 47 year old male.   Diagnostic Studies History Colonoscopy never  Allergies (Tanisha A. Owens Shark, [place];  No Known Drug Allergies 01/30/2017 Allergies Reconciled  Medication History (Tanisha A. Owens Shark, [place]; No Current Medications Medications Reconciled  Social History (Tanisha A. Owens Shark, [place]; Alcohol use Occasional alcohol use. Caffeine use Tea. No drug use Tobacco use Former smoker.  Family History (Tanisha A. Owens Shark, [place];  Alcohol Abuse Father. Arthritis Family Members In General, Mother. Cerebrovascular Accident Family Members In General, Father. Colon Cancer Family Members In General. Colon Polyps Family Members In General. Depression Family Members In General, Mother. Diabetes Mellitus Family Members In General. Heart Disease Family Members In General. Hypertension Family Members In General, Father, Mother. Kidney Disease Family Members In General.  Other Problems (Tanisha A. Owens Shark, RMA;  Diabetes Mellitus     Review of Systems (Tanisha A. Owens Shark RMA;  General Present- Fatigue. Not Present- Appetite Loss, Chills, Fever, Night Sweats, Weight Gain and Weight Loss. Skin Not Present- Change in Wart/Mole, Dryness, Hives, Jaundice, New Lesions, Non-Healing Wounds, Rash and Ulcer. HEENT Present- Seasonal Allergies. Not Present- Earache, Hearing Loss,  Hoarseness, Nose Bleed, Oral Ulcers, Ringing in the Ears, Sinus Pain, Sore Throat, Visual Disturbances, Wears glasses/contact lenses and Yellow Eyes. Respiratory Present- Snoring. Not Present- Bloody sputum, Chronic Cough, Difficulty Breathing and Wheezing. Breast Not Present- Breast Mass, Breast Pain, Nipple Discharge and Skin Changes. Cardiovascular Not Present- Chest Pain, Difficulty Breathing Lying Down, Leg Cramps, Palpitations, Rapid Heart Rate, Shortness of Breath and Swelling of Extremities. Gastrointestinal Not Present- Abdominal Pain, Bloating, Bloody Stool, Change in Bowel Habits, Chronic diarrhea, Constipation, Difficulty Swallowing, Excessive gas, Gets full quickly at meals, Hemorrhoids, Indigestion, Nausea, Rectal Pain and Vomiting. Male Genitourinary Present- Nocturia. Not Present- Blood in Urine, Change in Urinary Stream, Frequency, Impotence, Painful Urination, Urgency and Urine Leakage.  Vitals (Tanisha A. Brown RMA;  01/30/2017 10:44 AM Weight: 237 lb Height: 74in Body Surface Area: 2.34 m Body Mass Index: 30.43 kg/m  Temp.: 98.47F  Pulse: 100 (Regular)  BP: 142/84 (Sitting, Left Arm, Standard)      Physical Exam (Devyn Sheerin A. Oseias Horsey MD;   General Mental Status-Alert. General Appearance-Consistent with stated age. Hydration-Well hydrated. Voice-Normal.  Integumentary Note: Large 15 cm pedunculated  mass left axilla consistent with lipoma. Mobile.  Head and Neck Head-normocephalic, atraumatic with no lesions or palpable masses. Trachea-midline. Thyroid Gland Characteristics - normal size and consistency.  Chest and Lung Exam Chest and lung exam reveals -quiet, even and easy respiratory effort with no use of accessory muscles and on auscultation, normal breath sounds, no adventitious sounds and normal vocal resonance. Inspection Chest Wall - Normal. Back - normal.  Cardiovascular Cardiovascular examination reveals  -normal heart sounds, regular rate and rhythm with no murmurs and normal pedal pulses bilaterally.  Neurologic Neurologic evaluation reveals -alert and oriented x 3 with no impairment of recent or remote memory. Mental Status-Normal.  Musculoskeletal  Normal Exam - Left-Upper Extremity Strength Normal and Lower Extremity Strength Normal. Normal Exam - Right-Upper Extremity Strength Normal and Lower Extremity Strength Normal.  Lymphatic Axillary  General Axillary Region: Bilateral - Description - Normal. Tenderness - Non Tender.    Assessment & Plan (Hennie Gosa A. 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  Risk of bleeding, infection, nerve injury, blood vessel injury, anesthesia risks, cardiovascular events, cosmesis, and recurrence were discussed. He agrees to proceed.  Current Plans The pathophysiology of skin & subcutaneous masses was discussed. Natural history risks without surgery were discussed. I recommended surgery to remove the mass. I explained the technique of removal with use of local anesthesia & possible need for more aggressive sedation/anesthesia for patient comfort.  Risks such as bleeding, infection, wound breakdown, heart attack, death, and other risks were discussed. I noted a good likelihood this will help address the problem. Possibility that this will not correct all symptoms was explained. Possibility of regrowth/recurrence of the mass was discussed. We will work to minimize complications. Questions were answered. The patient expresses understanding & wishes to proceed with surgery.  Pt Education - CCS - General recommendations Pt Education - CCS Free Text Education/Instructions: discussed with patient and provided information.

## 2017-05-03 NOTE — Transfer of Care (Signed)
Immediate Anesthesia Transfer of Care Note  Patient: Allen Weber  Procedure(s) Performed: EXCISION LIPOMA LEFT AXILLA (Left Axilla)  Patient Location: PACU  Anesthesia Type:General  Level of Consciousness: sedated  Airway & Oxygen Therapy: Patient Spontanous Breathing and Patient connected to face mask oxygen  Post-op Assessment: Report given to RN and Post -op Vital signs reviewed and stable  Post vital signs: Reviewed and stable  Last Vitals:  Vitals:   05/03/17 1144  BP: 137/90  Pulse: 62  Temp: 36.8 C  SpO2: 100%    Last Pain:  Vitals:   05/03/17 1144  TempSrc: Oral         Complications: No apparent anesthesia complications

## 2017-05-03 NOTE — Anesthesia Postprocedure Evaluation (Signed)
Anesthesia Post Note  Patient: Allen Weber  Procedure(s) Performed: EXCISION LIPOMA LEFT AXILLA (Left Axilla)     Patient location during evaluation: PACU Anesthesia Type: General Level of consciousness: awake and alert Pain management: pain level controlled Vital Signs Assessment: post-procedure vital signs reviewed and stable Respiratory status: spontaneous breathing, nonlabored ventilation, respiratory function stable and patient connected to nasal cannula oxygen Cardiovascular status: blood pressure returned to baseline and stable Postop Assessment: no apparent nausea or vomiting Anesthetic complications: no    Last Vitals:  Vitals:   05/03/17 1405 05/03/17 1415  BP: (!) 107/59 123/85  Pulse: 78 76  Resp: 16 15  Temp: (!) 36.4 C   SpO2: 100% 100%    Last Pain:  Vitals:   05/03/17 1415  TempSrc:   PainSc: Asleep                 Shayde Gervacio

## 2017-05-03 NOTE — Anesthesia Preprocedure Evaluation (Addendum)
Anesthesia Evaluation  Patient identified by MRN, date of birth, ID band Patient awake    Reviewed: Allergy & Precautions, NPO status , Patient's Chart, lab work & pertinent test results  Airway Mallampati: II  TM Distance: >3 FB Neck ROM: Full    Dental no notable dental hx.    Pulmonary neg pulmonary ROS, former smoker,    Pulmonary exam normal breath sounds clear to auscultation       Cardiovascular negative cardio ROS Normal cardiovascular exam Rhythm:Regular Rate:Normal     Neuro/Psych negative neurological ROS  negative psych ROS   GI/Hepatic negative GI ROS, Neg liver ROS,   Endo/Other  diabetes, Oral Hypoglycemic AgentsMorbid obesity  Renal/GU   negative genitourinary   Musculoskeletal negative musculoskeletal ROS (+)   Abdominal   Peds negative pediatric ROS (+)  Hematology negative hematology ROS (+)   Anesthesia Other Findings   Reproductive/Obstetrics negative OB ROS                            Anesthesia Physical Anesthesia Plan  ASA: II  Anesthesia Plan: General   Post-op Pain Management:    Induction: Intravenous  PONV Risk Score and Plan: 1 and Ondansetron and Treatment may vary due to age or medical condition  Airway Management Planned: LMA and Oral ETT  Additional Equipment:   Intra-op Plan:   Post-operative Plan:   Informed Consent: I have reviewed the patients History and Physical, chart, labs and discussed the procedure including the risks, benefits and alternatives for the proposed anesthesia with the patient or authorized representative who has indicated his/her understanding and acceptance.   Dental Advisory Given  Plan Discussed with: CRNA, Surgeon and Anesthesiologist  Anesthesia Plan Comments: (ed.)       Anesthesia Quick Evaluation

## 2017-05-03 NOTE — Interval H&P Note (Signed)
History and Physical Interval Note:  05/03/2017 11:32 AM  Allen Weber  has presented today for surgery, with the diagnosis of lipoma  The various methods of treatment have been discussed with the patient and family. After consideration of risks, benefits and other options for treatment, the patient has consented to  Procedure(s): EXCISION LIPOMA LEFT AXILLA (Left) as a surgical intervention .  The patient's history has been reviewed, patient examined, no change in status, stable for surgery.  I have reviewed the patient's chart and labs.  Questions were answered to the patient's satisfaction.     [place]

## 2017-05-03 NOTE — Op Note (Signed)
Preoperative diagnosis: 15 cm x 20 cm pedunculated lipoma left axilla  Postoperative diagnosis: Same  Procedure: Excision of 15 cm x 20 cm left axilla pedunculated lipoma with complex closure of left axilla measuring 15 cm  Surgeon: Erroll Luna MD  Anesthesia: LMA with local  EBL: 30 cc  Drains: 19 round drain to left axilla  IV fluids: Per anesthesia record  Specimen: Left axillary mass to pathology  Indications for procedure: The patient presents for excision of a very large pedunculated masses left axilla is been present for many years.  There is the appearance of a large lipoma.  I discussed excision with the patient.  Discussed potential postop complications of bleeding, infection, cosmetic issues, recurrence,.  Discussed complex closure as well and possibility of scar tissue formation in the left axilla or stricture.  After discussion of all the above he wished to proceed.  Other complications discussed include bleeding, infection, nerve injury, blood vessel injury, numbness, limitation of range of motion depending on scar tissue formation, and the need for further surgeries and/or treatments and/or procedures.  Also discussed complications of exacerbation of his underlying medical problems death, DVT, and wound dehiscence.  He agreed to proceed.  Description of procedure: The patient was met in the holding area.  Questions were answered.  He was taken back to the operating room and placed supine.  The mass was marked in the holding area under his left axilla.  This area was prepped and draped in sterile fashion after induction of general anesthesia.  Timeout was done to verify proper patient procedure.  The large 15 x 20 cm fatty peduncle of the mass was noted.  A marking pen was used to excise the base.  This was done with a scalpel.  The mass was excised using cautery out of the axilla but this included a large swath of skin at its base in order to get back to negative margins.  This  did not invade into any axillary structures.  There were 2 axillary nodes looked abnormal that were taken with it.  These were level 1 nodes.  The long thoracic nerve, thoracodorsal trunk, and x-ray vein were all preserved.  Local anesthetic was infiltrated along the edge of the wound.  I then had to mobilize the subcutaneous skin given the large defect.  I created skin flaps by extending the incision both superiorly and inferiorly.  It was able to rotate the skin flaps to bring the wound together in a stellate configuration.  These advancement flaps help to closed the roughly 15 cm complex wound.  Once I did this through a separate stab incision and placed a round 19 drain.  Hemostasis was achieved.  We then began closure in a stellate configuration by advancing the skin flaps to the midline of the wound.  These were sutured together.  I was able to suture all the remainder wound together using 2-0 and 0 Vicryl and 4-0 Monocryl.  This gave an appearance of a stellate wound.  We had a good seal.  Dermabond was placed.  All final counts were found to be correct of sponge, needle and instruments.  Ace wrap placed.  The patient was awoke extubated taken recovery in satisfactory condition.

## 2017-05-03 NOTE — Discharge Instructions (Signed)
Ok to shower in 48 hours No lifting for 2 weeks  Empty drain daily as directed  Wear ACE wrap  Ok to move arm but limit overhead activity Follow up 10 -14 days for drain removal  Surgical [facility] Care Surgical drains are used to remove extra fluid that normally builds up in a surgical wound after surgery. A surgical drain helps to heal a surgical wound. Different kinds of surgical drains include:  Active drains. These drains use suction to pull drainage away from the surgical wound. Drainage flows through a tube to a container outside of the body. It is important to keep the bulb or the drainage container flat (compressed) at all times, except while you empty it. Flattening the bulb or container creates suction. The two most common types of active drains are bulb drains and Hemovac drains.  Passive drains. These drains allow fluid to drain naturally, by gravity. Drainage flows through a tube to a bandage (dressing) or a container outside of the body. Passive drains do not need to be emptied. The most common type of passive drain is the Penrose drain.  A drain is placed during surgery. Immediately after surgery, drainage is usually bright red and a little thicker than water. The drainage may gradually turn yellow or pink and become thinner. It is likely that your health care provider will remove the drain when the drainage stops or when the amount decreases to 1-2 Tbsp (15-30 mL) during a 24-hour period. How to care for your surgical drain  Keep the skin around the drain dry and covered with a dressing at all times.  Check your drain area every day for signs of infection. Check for: ? More redness, swelling, or pain. ? Pus or a bad smell. ? Cloudy drainage. Follow instructions from your health care provider about how to take care of your drain and how to change your dressing. Change your dressing at least one time every day. Change it more often if needed to keep the dressing dry. Make sure  you: 1. Gather your supplies, including: ? Tape. ? Germ-free cleaning solution (sterile saline). ? Split gauze drain sponge: 4 x 4 inches (10 x 10 cm). ? Gauze square: 4 x 4 inches (10 x 10 cm). 2. Wash your hands with soap and water before you change your dressing. If soap and water are not available, use hand sanitizer. 3. Remove the old dressing. Avoid using scissors to do that. 4. Use sterile saline to clean your skin around the drain. 5. Place the tube through the slit in a drain sponge. Place the drain sponge so that it covers your wound. 6. Place the gauze square or another drain sponge on top of the drain sponge that is on the wound. Make sure the tube is between those layers. 7. Tape the dressing to your skin. 8. If you have an active bulb or Hemovac drain, tape the drainage tube to your skin 1-2 inches (2.5-5 cm) below the place where the tube enters your body. Taping keeps the tube from pulling on any stitches (sutures) that you have. 9. Wash your hands with soap and water. 10. Write down the color of your drainage and how often you change your dressing.  How to empty your active bulb or Hemovac drain 1. Make sure that you have a measuring cup that you can empty your drainage into. 2. Wash your hands with soap and water. If soap and water are not available, use hand sanitizer. 3. Gently  move your fingers down the tube while squeezing very lightly. This is called stripping the tube. This clears any drainage, clots, or tissue from the tube. ? Do not pull on the tube. ? You may need to strip the tube several times every day to keep the tube clear. 4. Open the bulb cap or the drain plug. Do not touch the inside of the cap or the bottom of the plug. 5. Empty all of the drainage into the measuring cup. 6. Compress the bulb or the container and replace the cap or the plug. To compress the bulb or the container, squeeze it firmly in the middle while you close the cap or plug the  container. 7. Write down the amount of drainage that you have in each 24-hour period. If you have less than 2 Tbsp (30 mL) of drainage during 24 hours, contact your health care provider. 8. Flush the drainage down the toilet. 9. Wash your hands with soap and water. Contact a health care provider if:  You have more redness, swelling, or pain around your drain area.  The amount of drainage that you have is increasing instead of decreasing.  You have pus or a bad smell coming from your drain area.  You have a fever.  You have drainage that is cloudy.  There is a sudden stop or a sudden decrease in the amount of drainage that you have.  Your tube falls out.  Your active draindoes not stay compressedafter you empty it. This information is not intended to replace advice given to you by your health care provider. Make sure you discuss any questions you have with your health care provider. Document Released: 02/26/2000 Document Revised: 08/06/2015 Document Reviewed: 09/17/2014 Elsevier Interactive Patient Education  2018 [place] [facility] Instructions  Activity: Get plenty of rest for the remainder of the day. A responsible individual must stay with you for 24 hours following the procedure.  For the next 24 hours, DO NOT: -Drive a car -Paediatric nurse -Drink alcoholic beverages -Take any medication unless instructed by your physician -Make any legal decisions or sign important papers.  Meals: Start with liquid foods such as gelatin or soup. Progress to regular foods as tolerated. Avoid greasy, spicy, heavy foods. If nausea and/or vomiting occur, drink only clear liquids until the nausea and/or vomiting subsides. Call your physician if vomiting continues.  Special Instructions/Symptoms: Your throat may feel dry or sore from the anesthesia or the breathing tube placed in your throat during surgery. If this causes discomfort, gargle with warm salt water. The  discomfort should disappear within 24 hours.  If you had a scopolamine patch placed behind your ear for the management of post- operative nausea and/or vomiting:  1. The medication in the patch is effective for 72 hours, after which it should be removed.  Wrap patch in a tissue and discard in the trash. Wash hands thoroughly with soap and water. 2. You may remove the patch earlier than 72 hours if you experience unpleasant side effects which may include dry mouth, dizziness or visual disturbances. 3. Avoid touching the patch. Wash your hands with soap and water after contact with the patch.

## 2017-05-04 ENCOUNTER — Encounter (HOSPITAL_BASED_OUTPATIENT_CLINIC_OR_DEPARTMENT_OTHER): Payer: Self-pay | Admitting: Surgery

## 2019-06-01 ENCOUNTER — Ambulatory Visit: Payer: Self-pay | Attending: Internal Medicine

## 2019-06-01 DIAGNOSIS — Z23 Encounter for immunization: Secondary | ICD-10-CM

## 2019-06-01 NOTE — Progress Notes (Signed)
   Covid-19 Vaccination Clinic  Name:  Allen Weber    MRN: [id number] DOB: [birth year]  06/01/2019  [name] was observed post Covid-19 immunization for 15 minutes without incident. He was provided with Vaccine Information Sheet and instruction to access the V-Safe system.   [name] was instructed to call 911 with any severe reactions post vaccine: Marland Kitchen Difficulty breathing  . Swelling of face and throat  . A fast heartbeat  . A bad rash all over body  . Dizziness and weakness   Immunizations Administered    Name Date Dose VIS Date Route   Pfizer COVID-19 Vaccine 06/01/2019  8:53 AM 0.3 mL 02/22/2019 Intramuscular   Manufacturer: [place]   Lot: [id number]   [place] [address]: [id number]

## 2019-06-26 ENCOUNTER — Ambulatory Visit: Payer: Self-pay | Attending: Internal Medicine

## 2019-06-26 DIAGNOSIS — Z23 Encounter for immunization: Secondary | ICD-10-CM

## 2019-06-26 NOTE — Progress Notes (Signed)
   Covid-19 Vaccination Clinic  Name:  Jadeveon Boulet    MRN: [id number] DOB: 04/22/1970  06/26/2019  Mr. Bester was observed post Covid-19 immunization for 15 minutes without incident. He was provided with Vaccine Information Sheet and instruction to access the V-Safe system.   Mr. Michalko was instructed to call 911 with any severe reactions post vaccine: Marland Kitchen Difficulty breathing  . Swelling of face and throat  . A fast heartbeat  . A bad rash all over body  . Dizziness and weakness   Immunizations Administered    Name Date Dose VIS Date Route   Pfizer COVID-19 Vaccine 06/26/2019  4:41 PM 0.3 mL 02/22/2019 Intramuscular   Manufacturer: [place]   Lot: [id number]   [employer]: [id number]

## 2020-10-08 ENCOUNTER — Emergency Department (HOSPITAL_COMMUNITY)
Admission: EM | Admit: 2020-10-08 | Discharge: 2020-10-09 | Disposition: A | Discharge status: Home / Self Care | Payer: 59 | Attending: Emergency Medicine | Admitting: Emergency Medicine

## 2020-10-08 DIAGNOSIS — E119 Type 2 diabetes mellitus without complications: Secondary | ICD-10-CM | POA: Diagnosis not present

## 2020-10-08 DIAGNOSIS — Z8579 Personal history of other malignant neoplasms of lymphoid, hematopoietic and related tissues: Secondary | ICD-10-CM | POA: Insufficient documentation

## 2020-10-08 DIAGNOSIS — Z7984 Long term (current) use of oral hypoglycemic drugs: Secondary | ICD-10-CM | POA: Insufficient documentation

## 2020-10-08 DIAGNOSIS — N50811 Right testicular pain: Secondary | ICD-10-CM | POA: Diagnosis present

## 2020-10-08 DIAGNOSIS — N433 Hydrocele, unspecified: Secondary | ICD-10-CM | POA: Insufficient documentation

## 2020-10-09 ENCOUNTER — Other Ambulatory Visit: Payer: Self-pay

## 2020-10-09 ENCOUNTER — Emergency Department (HOSPITAL_COMMUNITY): Payer: 59

## 2020-10-09 LAB — COMPREHENSIVE METABOLIC PANEL
ALT: 18 U/L (ref 0–44)
AST: 17 U/L (ref 15–41)
Albumin: 4 g/dL (ref 3.5–5.0)
Alkaline Phosphatase: 50 U/L (ref 38–126)
Anion gap: 7 (ref 5–15)
BUN: 12 mg/dL (ref 6–20)
CO2: 30 mmol/L (ref 22–32)
Calcium: 9.4 mg/dL (ref 8.9–10.3)
Chloride: 101 mmol/L (ref 98–111)
Creatinine, Ser: 1.28 mg/dL — ABNORMAL HIGH (ref 0.61–1.24)
GFR, Estimated: >60 mL/min (ref >60)
Glucose, Bld: 182 mg/dL — ABNORMAL HIGH (ref 70–99)
Potassium: 3.6 mmol/L (ref 3.5–5.1)
Sodium: 138 mmol/L (ref 135–145)
Total Bilirubin: 0.6 mg/dL (ref 0.3–1.2)
Total Protein: 7.1 g/dL (ref 6.5–8.1)

## 2020-10-09 LAB — URINALYSIS, ROUTINE W REFLEX MICROSCOPIC
Bilirubin Urine: NEGATIVE
Glucose, UA: 50 mg/dL — AB
Ketones, ur: NEGATIVE mg/dL
Leukocytes,Ua: NEGATIVE
Nitrite: NEGATIVE
Protein, ur: 30 mg/dL — AB
RBC / HPF: >50 RBC/hpf — ABNORMAL HIGH (ref 0–5)
Specific Gravity, Urine: 1.03 (ref 1.005–1.030)
pH: 5 (ref 5.0–8.0)

## 2020-10-09 LAB — CBC
HCT: 37.8 % — ABNORMAL LOW (ref 39.0–52.0)
Hemoglobin: 12.4 g/dL — ABNORMAL LOW (ref 13.0–17.0)
MCH: 32.5 pg (ref 26.0–34.0)
MCHC: 32.8 g/dL (ref 30.0–36.0)
MCV: 99 fL (ref 80.0–100.0)
Platelets: 244 10*3/uL (ref 150–400)
RBC: 3.82 MIL/uL — ABNORMAL LOW (ref 4.22–5.81)
RDW: 11.1 % — ABNORMAL LOW (ref 11.5–15.5)
WBC: 7.2 10*3/uL (ref 4.0–10.5)
nRBC: 0 % (ref 0.0–0.2)

## 2020-10-09 MED ORDER — ACETAMINOPHEN 500 MG PO TABS
1000.0000 mg | ORAL_TABLET | Freq: Once | ORAL | Status: AC
  Administered 2020-10-09: 1000 mg via ORAL
  Filled 2020-10-09: qty 2

## 2020-10-09 MED ORDER — OXYCODONE HCL 5 MG PO TABS
5.0000 mg | ORAL_TABLET | Freq: Once | ORAL | Status: AC
  Administered 2020-10-09: 5 mg via ORAL
  Filled 2020-10-09: qty 1

## 2020-10-09 NOTE — Discharge Instructions (Addendum)
Tight wearing underwear.  Follow up with the urologist in the office.

## 2020-10-09 NOTE — ED Provider Notes (Signed)
[facility] EMERGENCY DEPARTMENT Provider Note   CSN: [id number] Arrival date & time: 10/08/20  2338     History Chief Complaint  Patient presents with   Flank Pain    Allen Weber is a 50 y.o. male.  50 yo M with a chief complaints of testicular pain.  This is been going on for a while now.  He said that today he was playing a video game and suddenly felt it worse on the right side and then it went up into his abdomen made him sick to his stomach and he vomited a few times.  Since then it has gotten a bit better and he feels like it is back now in his testicle.  Denies any injury to the scrotum.  Denies any hematuria.  Denies rash.  The history is provided by the patient.  Flank Pain Pertinent negatives include no chest pain, no abdominal pain, no headaches and no shortness of breath.  Testicle Pain This is a new problem. The current episode started 6 to 12 hours ago. The problem occurs every several days. The problem has been rapidly improving. Pertinent negatives include no chest pain, no abdominal pain, no headaches and no shortness of breath. Nothing aggravates the symptoms. Nothing relieves the symptoms. He has tried nothing for the symptoms. The treatment provided no relief.      Past Medical History:  Diagnosis Date   Diabetes mellitus without complication ([place])    Lipoma of axilla    left side    Patient Active Problem List   Diagnosis Date Noted   Lipoma of axilla 10/15/2013    Past Surgical History:  Procedure Laterality Date   LIPOMA EXCISION Left 05/03/2017   Procedure: EXCISION LIPOMA LEFT AXILLA;  Surgeon: Erroll Luna, MD;  Location: [place];  Service: General;  Laterality: Left;   NASAL FRACTURE SURGERY     as teenager       Family History  Problem Relation Age of Onset   Hypertension Mother    Diabetes Maternal Grandmother    Hypertension Maternal Grandmother    Diabetes Paternal Grandmother     Hypertension Paternal Grandmother     Social History   Tobacco Use   Smoking status: Former    Types: Cigarettes    Quit date: 07/23/2013    Years since quitting: 7.2   Smokeless tobacco: Never  Substance Use Topics   Alcohol use: No   Drug use: No    Home Medications Prior to Admission medications   Medication Sig Start Date End Date Taking? Authorizing Provider  ibuprofen (ADVIL,MOTRIN) 800 MG tablet Take 1 tablet (800 mg total) by mouth every 8 (eight) hours as needed. 05/03/17   Cornett, [name], MD  metFORMIN (GLUCOPHAGE) 500 MG tablet Take 500 mg by mouth daily with breakfast.    [provider]  oxyCODONE (OXY IR/ROXICODONE) 5 MG immediate release tablet Take 1 tablet (5 mg total) by mouth every 4 (four) hours as needed for severe pain. 05/03/17   Erroll Luna, MD    Allergies    Patient has no known allergies.  Review of Systems   Review of Systems  Constitutional:  Negative for chills and fever.  HENT:  Negative for congestion and facial swelling.   Eyes:  Negative for discharge and visual disturbance.  Respiratory:  Negative for shortness of breath.   Cardiovascular:  Negative for chest pain and palpitations.  Gastrointestinal:  Negative for abdominal pain, diarrhea and vomiting.  Genitourinary:  Positive for flank pain and testicular pain.  Musculoskeletal:  Negative for arthralgias and myalgias.  Skin:  Negative for color change and rash.  Neurological:  Negative for tremors, syncope and headaches.  Psychiatric/Behavioral:  Negative for confusion and dysphoric mood.    Physical Exam Updated Vital Signs BP 126/84 (BP Location: Right Arm)   Pulse 60   Temp 98.8 F (37.1 C) (Oral)   Resp 16   Ht '6\' 2"'$  (1.88 m)   Wt 106.6 kg   SpO2 99%   BMI 30.17 kg/m   Physical Exam Vitals and nursing note reviewed.  Constitutional:      Appearance: He is well-developed.  HENT:     Head: Normocephalic and atraumatic.  Eyes:     Pupils: Pupils are equal,  round, and reactive to light.  Neck:     Vascular: No JVD.  Cardiovascular:     Rate and Rhythm: Normal rate and regular rhythm.     Heart sounds: No murmur heard.   No friction rub. No gallop.  Pulmonary:     Effort: No respiratory distress.     Breath sounds: No wheezing.  Abdominal:     General: There is no distension.     Tenderness: There is no abdominal tenderness. There is no guarding or rebound.  Genitourinary:    Penis: Circumcised.      Comments: Pain to the posterior pole of the right testicle.  Normal lie cremasteric reflex intact bilaterally.  No noted masses in the inguinal canal. Musculoskeletal:        General: Normal range of motion.     Cervical back: Normal range of motion and neck supple.  Skin:    Coloration: Skin is not pale.     Findings: No rash.  Neurological:     Mental Status: He is alert and oriented to person, place, and time.  Psychiatric:        Behavior: Behavior normal.    ED Results / Procedures / Treatments   Labs (all labs ordered are listed, but only abnormal results are displayed) Labs Reviewed  COMPREHENSIVE METABOLIC PANEL - Abnormal; Notable for the following components:      Result Value   Glucose, Bld 182 (*)    Creatinine, Ser 1.28 (*)    All other components within normal limits  CBC - Abnormal; Notable for the following components:   RBC 3.82 (*)    Hemoglobin 12.4 (*)    HCT 37.8 (*)    RDW 11.1 (*)    All other components within normal limits  URINALYSIS, ROUTINE W REFLEX MICROSCOPIC - Abnormal; Notable for the following components:   APPearance CLOUDY (*)    Glucose, UA 50 (*)    Hgb urine dipstick LARGE (*)    Protein, ur 30 (*)    RBC / HPF >50 (*)    Bacteria, UA RARE (*)    All other components within normal limits    EKG None  Radiology US SCROTUM W/DOPPLER  Result Date: 10/09/2020 CLINICAL DATA:  Pain EXAM: SCROTAL ULTRASOUND DOPPLER ULTRASOUND OF THE TESTICLES TECHNIQUE: Complete ultrasound examination  of the testicles, epididymis, and other scrotal structures was performed. Color and spectral Doppler ultrasound were also utilized to evaluate blood flow to the testicles. COMPARISON:  None. FINDINGS: Right testicle Measurements: 4.3 x 2.1 x 3 cm. No mass or microlithiasis visualized. Left testicle Measurements: 3.5 x 1.9 x 3.1 cm. No mass or microlithiasis visualized. Right epididymis:  Normal in size and appearance. Left  epididymis:  Normal in size and appearance. Hydrocele:  Small, bilateral Varicocele:  None visualized. Pulsed Doppler interrogation of both testes demonstrates normal low resistance arterial and venous waveforms bilaterally. IMPRESSION: 1. No evidence of testicular torsion or mass. 2. Small bilateral hydroceles Electronically Signed   By: Lucrezia Europe M.D.   On: 10/09/2020 06:01    Procedures Procedures   Medications Ordered in ED Medications  acetaminophen (TYLENOL) tablet 1,000 mg (1,000 mg Oral Given 10/09/20 0506)  oxyCODONE (Oxy IR/ROXICODONE) immediate release tablet 5 mg (5 mg Oral Given 10/09/20 0507)    ED Course  I have reviewed the triage vital signs and the nursing notes.  Pertinent labs & imaging results that were available during my care of the patient were reviewed by me and considered in my medical decision making (see chart for details).    MDM Rules/Calculators/A&P                           50 yo M with a chief complaints of right-sided testicular pain.  Patient has had pain in his testicles off and on for some time.  Today it got much worse and caused him to have pain into the right lower quadrant and caused him to vomit.  That severe pain has resolved.  He has no pain on abdominal exam.  Does have pain likely in the area of epididymitis.  Will obtain a scrotal ultrasound.  Ultrasound with hydrocele.  No signs of epididymitis.  Negative for torsion.  Will treat supportively.  Urology follow-up.  6:27 AM:  I have discussed the diagnosis/risks/treatment  options with the patient and believe the pt to be eligible for discharge home to follow-up with Urology. We also discussed returning to the ED immediately if new or worsening sx occur. We discussed the sx which are most concerning (e.g., sudden worsening pain, fever, inability to tolerate by mouth) that necessitate immediate return. Medications administered to the patient during their visit and any new prescriptions provided to the patient are listed below.  Medications given during this visit Medications  acetaminophen (TYLENOL) tablet 1,000 mg (1,000 mg Oral Given 10/09/20 0506)  oxyCODONE (Oxy IR/ROXICODONE) immediate release tablet 5 mg (5 mg Oral Given 10/09/20 0507)     The patient appears reasonably screen and/or stabilized for discharge and I doubt any other medical condition or other [facility] requiring further screening, evaluation, or treatment in the ED at this time prior to discharge.   Final Clinical Impression(s) / ED Diagnoses Final diagnoses:  Hydrocele in adult    Rx / DC Orders ED Discharge Orders     None        Deno Etienne, DO 10/09/20 [id number]

## 2020-10-09 NOTE — ED Triage Notes (Signed)
Pt reported to ED with c/o pain to rt flank since earlier in the evening. Denies any urinary pain, retention or frequency. Reports vomiting prior to arrival.

## 2020-10-09 NOTE — ED Notes (Signed)
Patient verbalizes understanding of discharge instructions. Follow-up care reviewed. Opportunity for questioning and answers were provided. Armband removed by staff, pt discharged from ED ambulatory.  

## 2020-12-29 ENCOUNTER — Other Ambulatory Visit: Payer: Self-pay | Admitting: Urology

## 2020-12-31 ENCOUNTER — Encounter (HOSPITAL_BASED_OUTPATIENT_CLINIC_OR_DEPARTMENT_OTHER): Payer: Self-pay | Admitting: Urology

## 2020-12-31 DIAGNOSIS — N201 Calculus of ureter: Secondary | ICD-10-CM

## 2020-12-31 HISTORY — DX: Calculus of ureter: N20.1

## 2021-01-01 ENCOUNTER — Encounter (HOSPITAL_BASED_OUTPATIENT_CLINIC_OR_DEPARTMENT_OTHER): Payer: Self-pay | Admitting: Urology

## 2021-01-01 ENCOUNTER — Other Ambulatory Visit: Payer: Self-pay

## 2021-01-01 NOTE — Progress Notes (Signed)
Spoke w/ via phone for pre-op interview---pt Lab needs dos---- I stat, ekg              Lab results------none COVID test -----patient states asymptomatic no test needed Arrive at -------630 am 01-08-2021 NPO after MN NO Solid Food.  Clear liquids from MN until---530 am Med rec completed Medications to take morning of surgery -----none Diabetic medication -----none day of surgery Patient instructed to bring photo id and insurance card day of surgery Patient aware to have Driver (ride ) / caregiver    for 24 hours after surgery  [name] Patient Special Instructions -----none Pre-Op special Istructions -----none Patient verbalized understanding of instructions that were given at this phone interview. Patient denies shortness of breath, chest pain, fever, cough at this phone interview.

## 2021-01-07 NOTE — H&P (Signed)
I have testicular pain.  HPI: Allen Weber is a 50 year-old male established patient who is here for testicularl pain.  12/25/2020: Allen Weber was diagnosed with a right UVJ stone at last office visit. He has not seen a stone pass. He denies episodes of pain. He denies gross hematuria. He denies any voiding complaints today.   Allen Weber is a 50 yo male who was in the ER in July for right testicular and flank pain that was severe and associated with some nausea and vomiting. he had >50 RBC's on a UA and a Cr of 1.28. A scrotal US was done that showed no significant findings but no abdominal imaging was done. He continues to have some issues with the testicular pain but no flank pain. He has no history of UTI's, Stones or GU surgery.   The problem is on the right side.     ALLERGIES: No Allergies    MEDICATIONS: Metformin Hcl  Percocet 5 mg-325 mg tablet 1 tablet PO Q 6 H PRN  Tamsulosin Hcl 0.4 mg capsule 1 capsule PO Daily  Lovastatin     GU PSH: No GU PSH      PSH Notes: Nose surgery- 1985   NON-GU PSH: No Non-GU PSH    GU PMH: Flank Pain - 11/27/2020 Microscopic hematuria - 11/27/2020 Right testicular pain, He had right testicular and flank pain with N/V and microhematuria and the CT today shows a 4x21mm RUVJ stone without obstruction. I will start him on tamsulosin and give him a few oxycodone. He will return in 3 weeks with a KUB. If the stone doesn't progress we Allen Weber consider URS or ESWL. - 11/27/2020 Ureteral calculus - 11/27/2020    NON-GU PMH: Diabetes Type 2    FAMILY HISTORY: 1 Daughter - Other 2 sons - Other Death In The Family Father - Other Diabetes - Runs in Family High Blood Pressure - Runs in Family Kidney Failure - Grandmother Prostate Cancer - Grandfather   SOCIAL HISTORY: Marital Status: Unknown Preferred Language: English; Ethnicity: Not Hispanic Or Latino; Race: Black or African American Current Smoking Status: Patient smokes occasionally.   Tobacco Use Assessment  Completed: Used Tobacco in last 30 days? Does not use smokeless tobacco. Drinks 1 drink per week.  Does not use drugs. Drinks 1 caffeinated drink per day. 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    REVIEW OF SYSTEMS:    GU Review Male:   Patient denies frequent urination, hard to postpone urination, burning/ pain with urination, get up at night to urinate, leakage of urine, stream starts and stops, trouble starting your stream, have to strain to urinate , erection problems, and penile pain.  Gastrointestinal (Upper):   Patient denies nausea, vomiting, and indigestion/ heartburn.  Gastrointestinal (Lower):   Patient denies diarrhea and constipation.  Constitutional:   Patient denies fever, night sweats, weight loss, and fatigue.  Musculoskeletal:   Patient denies back pain and joint pain.  Neurological:   Patient denies headaches and dizziness.  Psychologic:   Patient denies depression and anxiety.   VITAL SIGNS:      12/25/2020 09:00 AM  BP 135/82 mmHg  Heart Rate 66 /min  Temperature 98.0 F / 36.6 C   MULTI-SYSTEM PHYSICAL EXAMINATION:    Constitutional: Well-nourished. No physical deformities. Normally developed. Good grooming.  Cardiovascular: Normal temperature, normal extremity pulses, no swelling, no varicosities.  Skin: No paleness, no jaundice, no cyanosis. No lesion, no ulcer, no rash.  Neurologic / Psychiatric: Oriented to time, oriented to place, oriented  to person. No depression, no anxiety, no agitation.  Gastrointestinal: No mass, no tenderness, no rigidity, non obese abdomen.     Complexity of Data:  Source Of History:  Patient, Medical Record Summary  Records Review:   Previous Doctor Records, Previous Patient Records  Urine Test Review:   Urinalysis, Urine Culture  Urodynamics Review:   Review Bladder Scan  X-Ray Review: KUB: Reviewed Films. Reviewed Report. Discussed With Patient.  Renal Ultrasound (Limited): Reviewed Films. Reviewed Report. Discussed With Patient.   C.T. Abdomen/Pelvis: Reviewed Films. Reviewed Report. Discussed With Patient.     12/25/20 12/25/20  Urinalysis  Urine Appearance Clear  Clear   Urine Color Yellow  Yellow   Urine Glucose Neg  Neg mg/dL  Urine Bilirubin Neg  Neg mg/dL  Urine Ketones Neg  Neg mg/dL  Urine Specific Gravity 1.020  1.020   Urine Blood Neg  Neg ery/uL  Urine pH 6.0  6.0   Urine Protein Neg  Neg mg/dL  Urine Urobilinogen 0.2  0.2 mg/dL  Urine Nitrites Neg  Neg   Urine Leukocyte Esterase Neg  Neg leu/uL   PROCEDURES:         Renal Ultrasound (Limited) - 38250  RT Kidney: Length: 11.6 cm Depth:5.1 cm Cortical Width: 1.4 cm Width:4.9 cm    Right Kidney/Ureter:  APPEARS WNLS  Bladder:  PVR=114.38ML. ? Stone seen in the bladder= 0.74cm.      Patient confirmed No Neulasta OnPro Device.            KUB - [id number]  A single view of the abdomen is obtained. A bilateral renal shadows are visualized. I do not appreciate an opacity within either renal shadow. Tracing along the expected course of each ureter there are no appreciable opacities. There is a overlying bowel gas pattern.      Patient confirmed No Neulasta OnPro Device.           Urinalysis - 81003 Dipstick Dipstick Cont'd  Color: Yellow Bilirubin: Neg  Appearance: Clear Ketones: Neg  Specific Gravity: 1.020 Blood: Neg  pH: 6.0 Protein: Neg  Glucose: Neg Urobilinogen: 0.2    Nitrites: Neg    Leukocyte Esterase: Neg    Notes:      ASSESSMENT:      ICD-10 Details  1 GU:   Pelvic/perineal pain - N39.7 Acute, Uncomplicated, Improving  2   Ureteral calculus - N20.1 Right, Acute, Uncomplicated   PLAN:           Orders X-Rays: Renal Ultrasound (Limited) - Right please-Looking for Distal UVJ stone          Schedule Return Visit/Planned Activity: Next Available Appointment - Schedule Surgery          Document Letter(s):  Created for Patient: Clinical Summary         Notes:   Urinalysis is clear today. On KUB I am unable to  appreciate an opacity that is consistent with previously noted stone. On renal ultrasound today there is a probable stone noted in the bladder near the right UV J. this is consistent with previously noted stone on CT scan. Due to not being able to visualize stone on KUB I did discuss options including continued medical expulsive therapy versus ureteroscopy. The risks of each were discussed in detail. He was willing to proceed with ureteroscopy. I will discuss this further with his urologist and ensure ESWL is not possible prior to scheduling him. I advised strict return precautions in the interval. He voiced his understanding.

## 2021-01-08 ENCOUNTER — Ambulatory Visit (HOSPITAL_BASED_OUTPATIENT_CLINIC_OR_DEPARTMENT_OTHER): Payer: 59 | Admitting: Certified Registered"

## 2021-01-08 ENCOUNTER — Other Ambulatory Visit: Payer: Self-pay

## 2021-01-08 ENCOUNTER — Ambulatory Visit (HOSPITAL_BASED_OUTPATIENT_CLINIC_OR_DEPARTMENT_OTHER)
Admission: RE | Admit: 2021-01-08 | Discharge: 2021-01-08 | Disposition: A | Discharge status: Home / Self Care | Payer: 59 | Source: Ambulatory Visit | Attending: Urology | Admitting: Urology

## 2021-01-08 ENCOUNTER — Encounter (HOSPITAL_BASED_OUTPATIENT_CLINIC_OR_DEPARTMENT_OTHER)
Admission: RE | Disposition: A | Discharge status: Home / Self Care | Payer: Self-pay | Source: Ambulatory Visit | Attending: Urology

## 2021-01-08 ENCOUNTER — Encounter (HOSPITAL_BASED_OUTPATIENT_CLINIC_OR_DEPARTMENT_OTHER): Payer: Self-pay | Admitting: Urology

## 2021-01-08 DIAGNOSIS — N201 Calculus of ureter: Secondary | ICD-10-CM | POA: Diagnosis not present

## 2021-01-08 DIAGNOSIS — E119 Type 2 diabetes mellitus without complications: Secondary | ICD-10-CM | POA: Diagnosis not present

## 2021-01-08 DIAGNOSIS — Z7984 Long term (current) use of oral hypoglycemic drugs: Secondary | ICD-10-CM | POA: Diagnosis not present

## 2021-01-08 DIAGNOSIS — F17200 Nicotine dependence, unspecified, uncomplicated: Secondary | ICD-10-CM | POA: Insufficient documentation

## 2021-01-08 HISTORY — PX: CYSTOSCOPY/URETEROSCOPY/HOLMIUM LASER/STENT PLACEMENT: SHX6546

## 2021-01-08 HISTORY — DX: Hyperlipidemia, unspecified: E78.5

## 2021-01-08 LAB — POCT I-STAT, CHEM 8
BUN: 12 mg/dL (ref 6–20)
Calcium, Ion: 1.29 mmol/L (ref 1.15–1.40)
Chloride: 99 mmol/L (ref 98–111)
Creatinine, Ser: 1 mg/dL (ref 0.61–1.24)
Glucose, Bld: 161 mg/dL — ABNORMAL HIGH (ref 70–99)
HCT: 43 % (ref 39.0–52.0)
Hemoglobin: 14.6 g/dL (ref 13.0–17.0)
Potassium: 4 mmol/L (ref 3.5–5.1)
Sodium: 141 mmol/L (ref 135–145)
TCO2: 33 mmol/L — ABNORMAL HIGH (ref 22–32)

## 2021-01-08 LAB — GLUCOSE, CAPILLARY: Glucose-Capillary: 155 mg/dL — ABNORMAL HIGH (ref 70–99)

## 2021-01-08 SURGERY — CYSTOSCOPY/URETEROSCOPY/HOLMIUM LASER/STENT PLACEMENT
Anesthesia: General | Site: Renal | Laterality: Right

## 2021-01-08 MED ORDER — SODIUM CHLORIDE 0.9 % IR SOLN
Status: DC | PRN
  Administered 2021-01-08: 1500 mL

## 2021-01-08 MED ORDER — SODIUM CHLORIDE 0.9% FLUSH: 3.0000 mL | INTRAVENOUS | Status: DC | PRN

## 2021-01-08 MED ORDER — ONDANSETRON HCL 4 MG/2ML IJ SOLN
INTRAMUSCULAR | Status: AC
  Filled 2021-01-08: qty 2

## 2021-01-08 MED ORDER — LIDOCAINE 2% (20 MG/ML) 5 ML SYRINGE
INTRAMUSCULAR | Status: AC
  Filled 2021-01-08: qty 5

## 2021-01-08 MED ORDER — ACETAMINOPHEN 500 MG PO TABS
1000.0000 mg | ORAL_TABLET | Freq: Once | ORAL | Status: AC
  Administered 2021-01-08: 1000 mg via ORAL

## 2021-01-08 MED ORDER — IOHEXOL 300 MG/ML  SOLN
INTRAMUSCULAR | Status: DC | PRN
  Administered 2021-01-08: 2.5 mL via URETHRAL

## 2021-01-08 MED ORDER — SODIUM CHLORIDE 0.9 % IV SOLN: 250.0000 mL | INTRAVENOUS | Status: DC | PRN

## 2021-01-08 MED ORDER — LIDOCAINE 2% (20 MG/ML) 5 ML SYRINGE
INTRAMUSCULAR | Status: DC | PRN
  Administered 2021-01-08: 60 mg via INTRAVENOUS

## 2021-01-08 MED ORDER — PROPOFOL 10 MG/ML IV BOLUS
INTRAVENOUS | Status: AC
  Filled 2021-01-08: qty 40

## 2021-01-08 MED ORDER — OXYCODONE HCL 5 MG PO TABS: 5.0000 mg | ORAL_TABLET | ORAL | Status: DC | PRN

## 2021-01-08 MED ORDER — FENTANYL CITRATE (PF) 100 MCG/2ML IJ SOLN
INTRAMUSCULAR | Status: DC | PRN
  Administered 2021-01-08 (×2): 50 ug via INTRAVENOUS

## 2021-01-08 MED ORDER — PHENAZOPYRIDINE HCL 100 MG PO TABS
200.0000 mg | ORAL_TABLET | Freq: Once | ORAL | Status: AC
  Administered 2021-01-08: 200 mg via ORAL

## 2021-01-08 MED ORDER — MIDAZOLAM HCL 2 MG/2ML IJ SOLN
INTRAMUSCULAR | Status: DC | PRN
  Administered 2021-01-08: 2 mg via INTRAVENOUS

## 2021-01-08 MED ORDER — ACETAMINOPHEN 325 MG PO TABS: 650.0000 mg | ORAL_TABLET | ORAL | Status: DC | PRN

## 2021-01-08 MED ORDER — PHENAZOPYRIDINE HCL 100 MG PO TABS
ORAL_TABLET | ORAL | Status: AC
  Filled 2021-01-08: qty 2

## 2021-01-08 MED ORDER — PROPOFOL 10 MG/ML IV BOLUS
INTRAVENOUS | Status: DC | PRN
  Administered 2021-01-08: 300 mg via INTRAVENOUS

## 2021-01-08 MED ORDER — MORPHINE SULFATE (PF) 4 MG/ML IV SOLN: 2.0000 mg | INTRAVENOUS | Status: DC | PRN

## 2021-01-08 MED ORDER — KETOROLAC TROMETHAMINE 30 MG/ML IJ SOLN
INTRAMUSCULAR | Status: AC
  Filled 2021-01-08: qty 1

## 2021-01-08 MED ORDER — MIDAZOLAM HCL 2 MG/2ML IJ SOLN
INTRAMUSCULAR | Status: AC
  Filled 2021-01-08: qty 2

## 2021-01-08 MED ORDER — ACETAMINOPHEN 500 MG PO TABS
ORAL_TABLET | ORAL | Status: AC
  Filled 2021-01-08: qty 2

## 2021-01-08 MED ORDER — ONDANSETRON HCL 4 MG/2ML IJ SOLN
INTRAMUSCULAR | Status: DC | PRN
  Administered 2021-01-08: 4 mg via INTRAVENOUS

## 2021-01-08 MED ORDER — AMISULPRIDE (ANTIEMETIC) 5 MG/2ML IV SOLN: 10.0000 mg | Freq: Once | INTRAVENOUS | Status: DC | PRN

## 2021-01-08 MED ORDER — DEXAMETHASONE SODIUM PHOSPHATE 10 MG/ML IJ SOLN
INTRAMUSCULAR | Status: AC
  Filled 2021-01-08: qty 1

## 2021-01-08 MED ORDER — CEFAZOLIN SODIUM-DEXTROSE 2-4 GM/100ML-% IV SOLN
INTRAVENOUS | Status: AC
  Filled 2021-01-08: qty 100

## 2021-01-08 MED ORDER — OXYCODONE-ACETAMINOPHEN 5-325 MG PO TABS: 1.0000 | ORAL_TABLET | Freq: Four times a day (QID) | ORAL | 0 refills | Status: AC | PRN

## 2021-01-08 MED ORDER — DEXAMETHASONE SODIUM PHOSPHATE 10 MG/ML IJ SOLN
INTRAMUSCULAR | Status: DC | PRN
  Administered 2021-01-08: 5 mg via INTRAVENOUS

## 2021-01-08 MED ORDER — LACTATED RINGERS IV SOLN: INTRAVENOUS | Status: DC

## 2021-01-08 MED ORDER — FENTANYL CITRATE (PF) 100 MCG/2ML IJ SOLN: 25.0000 ug | INTRAMUSCULAR | Status: DC | PRN

## 2021-01-08 MED ORDER — ACETAMINOPHEN 325 MG RE SUPP: 650.0000 mg | RECTAL | Status: DC | PRN

## 2021-01-08 MED ORDER — SODIUM CHLORIDE 0.9% FLUSH: 3.0000 mL | Freq: Two times a day (BID) | INTRAVENOUS | Status: DC

## 2021-01-08 MED ORDER — 0.9 % SODIUM CHLORIDE (POUR BTL) OPTIME
TOPICAL | Status: DC | PRN
  Administered 2021-01-08: 500 mL

## 2021-01-08 MED ORDER — CEFAZOLIN SODIUM-DEXTROSE 2-4 GM/100ML-% IV SOLN
2.0000 g | INTRAVENOUS | Status: AC
  Administered 2021-01-08: 2 g via INTRAVENOUS

## 2021-01-08 MED ORDER — FENTANYL CITRATE (PF) 100 MCG/2ML IJ SOLN
INTRAMUSCULAR | Status: AC
  Filled 2021-01-08: qty 2

## 2021-01-08 SURGICAL SUPPLY — 27 items
BAG DRAIN URO-CYSTO SKYTR STRL (DRAIN) ×2 IMPLANT
BAG DRN UROCATH (DRAIN) ×1
BASKET STONE 1.7 NGAGE (UROLOGICAL SUPPLIES) ×2 IMPLANT
BASKET ZERO TIP NITINOL 2.4FR (BASKET) IMPLANT
BSKT STON RTRVL ZERO TP 2.4FR (BASKET)
CATH URET 5FR 28IN CONE TIP (BALLOONS)
CATH URET 5FR 28IN OPEN ENDED (CATHETERS) ×2 IMPLANT
CATH URET 5FR 70CM CONE TIP (BALLOONS) IMPLANT
CLOTH BEACON ORANGE TIMEOUT ST (SAFETY) ×2 IMPLANT
ELECT REM PT RETURN 9FT ADLT (ELECTROSURGICAL)
ELECTRODE REM PT RTRN 9FT ADLT (ELECTROSURGICAL) IMPLANT
FIBER LASER FLEXIVA 365 (UROLOGICAL SUPPLIES) IMPLANT
GLOVE SURG POLYISO LF SZ8 (GLOVE) ×2 IMPLANT
GOWN STRL REUS W/TWL XL LVL3 (GOWN DISPOSABLE) ×2 IMPLANT
GUIDEWIRE ANG ZIPWIRE 038X150 (WIRE) IMPLANT
GUIDEWIRE STR DUAL SENSOR (WIRE) ×2 IMPLANT
INFUSOR MANOMETER BAG 3000ML (MISCELLANEOUS) IMPLANT
IV NS IRRIG 3000ML ARTHROMATIC (IV SOLUTION) ×2 IMPLANT
KIT TURNOVER CYSTO (KITS) ×2 IMPLANT
MANIFOLD NEPTUNE II (INSTRUMENTS) ×2 IMPLANT
NS IRRIG 500ML POUR BTL (IV SOLUTION) ×2 IMPLANT
PACK CYSTO (CUSTOM PROCEDURE TRAY) ×2 IMPLANT
SHEATH URETERAL 12FRX35CM (MISCELLANEOUS) ×2 IMPLANT
TRACTIP FLEXIVA PULS ID 200XHI (Laser) IMPLANT
TRACTIP FLEXIVA PULSE ID 200 (Laser)
TUBE CONNECTING 12X1/4 (SUCTIONS) ×2 IMPLANT
TUBING UROLOGY SET (TUBING) ×2 IMPLANT

## 2021-01-08 NOTE — Anesthesia Postprocedure Evaluation (Signed)
Anesthesia Post Note  Patient: Allen Weber  Procedure(s) Performed: CYSTOSCOPY RIGHT RETROGRADE PYELOGRAM URETEROSCOPY/BASKET STONE EXTRACTION (Right: Renal)     Patient location during evaluation: PACU Anesthesia Type: General Level of consciousness: awake and alert Pain management: pain level controlled Vital Signs Assessment: post-procedure vital signs reviewed and stable Respiratory status: spontaneous breathing, nonlabored ventilation, respiratory function stable and patient connected to nasal cannula oxygen Cardiovascular status: blood pressure returned to baseline and stable Postop Assessment: no apparent nausea or vomiting Anesthetic complications: no   No notable events documented.  Last Vitals:  Vitals:   01/08/21 0945 01/08/21 1015  BP: 128/81 (!) 143/98  Pulse: (!) 58 (!) 56  Resp: 16 14  Temp: 36.4 C 36.4 C  SpO2: 96% 98%    Last Pain:  Vitals:   01/08/21 1000  TempSrc:   PainSc: 6                  Tiajuana Amass

## 2021-01-08 NOTE — Anesthesia Preprocedure Evaluation (Signed)
Anesthesia Evaluation  Patient identified by MRN, date of birth, ID band Patient awake    Reviewed: Allergy & Precautions, NPO status , Patient's Chart, lab work & pertinent test results  Airway Mallampati: II  TM Distance: >3 FB Neck ROM: Full    Dental  (+) Dental Advisory Given   Pulmonary former smoker,    breath sounds clear to auscultation       Cardiovascular negative cardio ROS   Rhythm:Regular Rate:Normal     Neuro/Psych negative neurological ROS     GI/Hepatic negative GI ROS, Neg liver ROS,   Endo/Other  diabetes, Type 2, Oral Hypoglycemic Agents  Renal/GU negative Renal ROS     Musculoskeletal   Abdominal   Peds  Hematology negative hematology ROS (+)   Anesthesia Other Findings   Reproductive/Obstetrics                             Anesthesia Physical Anesthesia Plan  ASA: 2  Anesthesia Plan: General   Post-op Pain Management:    Induction: Intravenous  PONV Risk Score and Plan: 2 and Dexamethasone, Ondansetron and Treatment may vary due to age or medical condition  Airway Management Planned: LMA  Additional Equipment: None  Intra-op Plan:   Post-operative Plan: Extubation in OR  Informed Consent: I have reviewed the patients History and Physical, chart, labs and discussed the procedure including the risks, benefits and alternatives for the proposed anesthesia with the patient or authorized representative who has indicated his/her understanding and acceptance.     Dental advisory given  Plan Discussed with: CRNA  Anesthesia Plan Comments:         Anesthesia Quick Evaluation

## 2021-01-08 NOTE — Op Note (Signed)
Procedure: 1.  Cystoscopy with right retrograde pyelogram and interpretation. 2.  Right ureteroscopic stone extraction.  Preop diagnosis: Right UVJ stone.  Postop diagnosis: Same.  Surgeon: Dr. Irine Seal.  Anesthesia: General.  Specimen: Stone.  Drain: None.  EBL: None.  Complications: None.  Indications: The patient is a 50 year old male with a 5 x 8 mm right distal ureteral stone with nonprogression.  He is elected to undergo ureteroscopy.  Procedure: He was taken operating room where he was given antibiotic.  A general anesthetic was induced.  He was placed in lithotomy position and fitted with PAS hose.  His perineum and genitalia were prepped with Betadine solution and draped in usual sterile fashion.  Cystoscopy was performed using a 21 [place] scope and 30 degree lens.  Examination revealed a normal urethra.  The external sphincter was intact.  The prostatic urethra was short with mild bilobar hyperplasia without significant obstruction.  Examination of bladder revealed a smooth bladder wall without tumors, stones or inflammation.  Ureteral orifices were quite closed to the bladder neck particularly on the right almost suggestive of a simple ureterocele.  There was mild edema and the area of the right ureteral orifice.  The left was otherwise unremarkable.  A 5 French open-ended catheter was passed and a right retrograde pyelogram was performed with Omnipaque.  The retrograde pyelogram demonstrated a suggestion of a filling defect in the distal ureter but it was somewhat obscured by the pubis.  There was no significant ureteral dilation or other filling defects in the collecting system.  A sensor wire was advanced through the open-ended catheter into the renal collecting system and the open-ended catheter and cystoscope were removed.  A 12 French 35 cm digital access sheath inner core was then passed over the wire and the distal ureter was dilated.  There was some grittiness upon  passing the distal ureter suggestive of stone.  A 6.5 French dual-lumen semirigid ureteroscope was then passed alongside the wire and the stone was visualized in the intramural ureter.  An engage basket was then used to remove the stone intact.  Final inspection revealed no significant ureteral trauma and a well dilated meatus.  It was felt that stent placement was not indicated.  The cystoscope was then reinserted and the wire was removed.  The bladder was drained and the cystoscope was removed.  He was taken down from lithotomy position, his anesthetic was reversed and he was moved to recovery in stable condition.  There were no complications.  He will be instructed to bring his stone to the office for analysis.

## 2021-01-08 NOTE — Anesthesia Postprocedure Evaluation (Signed)
Anesthesia Post Note  Patient: MAXIMILLIANO KERSH  Procedure(s) Performed: CYSTOSCOPY RIGHT RETROGRADE PYELOGRAM URETEROSCOPY/BASKET STONE EXTRACTION (Right: Renal)     Patient location during evaluation: PACU Anesthesia Type: General Level of consciousness: awake and alert Pain management: pain level controlled Vital Signs Assessment: post-procedure vital signs reviewed and stable Respiratory status: spontaneous breathing, nonlabored ventilation, respiratory function stable and patient connected to nasal cannula oxygen Cardiovascular status: blood pressure returned to baseline and stable Postop Assessment: no apparent nausea or vomiting Anesthetic complications: no   No notable events documented.  Last Vitals:  Vitals:   01/08/21 0945 01/08/21 1015  BP: 128/81 (!) 143/98  Pulse: (!) 58 (!) 56  Resp: 16 14  Temp: 36.4 C 36.4 C  SpO2: 96% 98%    Last Pain:  Vitals:   01/08/21 1000  TempSrc:   PainSc: 6                  Tiajuana Amass

## 2021-01-08 NOTE — Discharge Instructions (Addendum)
CYSTOSCOPY HOME CARE INSTRUCTIONS  Activity: Rest for the remainder of the day.  Do not drive or operate equipment today.  You may resume normal activities in one to two days as instructed by your physician.   Meals: Drink plenty of liquids and eat light foods such as gelatin or soup this evening.  You may return to a normal meal plan tomorrow.  Return to Work: You may return to work in one to two days or as instructed by your physician.  Special Instructions / Symptoms: Call your physician if any of these symptoms occur:   -persistent or heavy bleeding  -bleeding which continues after first few urination  -large blood clots that are difficult to pass  -urine stream diminishes or stops completely  -fever equal to or higher than 101 degrees Farenheit.  -cloudy urine with a strong, foul odor  -severe pain  Please bring your stone to the office for analysis.    Post Anesthesia Home Care Instructions  Activity: Get plenty of rest for the remainder of the day. A responsible individual must stay with you for 24 hours following the procedure.  For the next 24 hours, DO NOT: -Drive a car -Paediatric nurse -Drink alcoholic beverages -Take any medication unless instructed by your physician -Make any legal decisions or sign important papers.  Meals: Start with liquid foods such as gelatin or soup. Progress to regular foods as tolerated. Avoid greasy, spicy, heavy foods. If nausea and/or vomiting occur, drink only clear liquids until the nausea and/or vomiting subsides. Call your physician if vomiting continues.  Special Instructions/Symptoms: Your throat may feel dry or sore from the anesthesia or the breathing tube placed in your throat during surgery. If this causes discomfort, gargle with warm salt water. The discomfort should disappear within 24 hours.  Next dose of Tylenol (acetaminophen) after 1:15 PM today if needed.

## 2021-01-08 NOTE — Transfer of Care (Signed)
Immediate Anesthesia Transfer of Care Note  Patient: Allen Weber  Procedure(s) Performed: Procedure(s) (LRB): CYSTOSCOPY RIGHT RETROGRADE PYELOGRAM URETEROSCOPY/BASKET STONE EXTRACTION (Right)  Patient Location: PACU  Anesthesia Type: General  Level of Consciousness: awake, oriented, sedated and patient cooperative  Airway & Oxygen Therapy: Patient Spontanous Breathing and Patient connected to face mask oxygen  Post-op Assessment: Report given to PACU RN and Post -op Vital signs reviewed and stable  Post vital signs: Reviewed and stable  Complications: No apparent anesthesia complications Last Vitals:  Vitals Value Taken Time  BP    Temp    Pulse 70 01/08/21 0916  Resp 14 01/08/21 0916  SpO2 97 % 01/08/21 0916  Vitals shown include unvalidated device data.  Last Pain:  Vitals:   01/08/21 0713  TempSrc: Oral  PainSc: 0-No pain      Patients Stated Pain Goal: 4 (49/70/26 [id number])  Complications: No notable events documented.

## 2021-01-08 NOTE — Anesthesia Procedure Notes (Signed)
Procedure Name: LMA Insertion Date/Time: 01/08/2021 8:41 AM Performed by: Suan Halter, CRNA Pre-anesthesia Checklist: Patient identified, Emergency Drugs available, Suction available and Patient being monitored Patient Re-evaluated:Patient Re-evaluated prior to induction Oxygen Delivery Method: Circle system utilized Preoxygenation: Pre-oxygenation with 100% oxygen Induction Type: IV induction Ventilation: Mask ventilation without difficulty LMA: LMA inserted LMA Size: 5.0 Number of attempts: 1 Airway Equipment and Method: Bite block Placement Confirmation: positive ETCO2 Tube secured with: Tape Dental Injury: Teeth and Oropharynx as per pre-operative assessment

## 2021-01-08 NOTE — Interval H&P Note (Signed)
History and Physical Interval Note:  He has not had pain recently but hasn't seen a stone pass.    01/08/2021 8:15 AM  Allen Weber  has presented today for surgery, with the diagnosis of RIGHT URETERAL STONE.  The various methods of treatment have been discussed with the patient and family. After consideration of risks, benefits and other options for treatment, the patient has consented to  Procedure(s): CYSTOSCOPY RIGHT RETROGRADE PYELOGRAM URETEROSCOPY/HOLMIUM LASER (Right) as a surgical intervention.  The patient's history has been reviewed, patient examined, no change in status, stable for surgery.  I have reviewed the patient's chart and labs.  Questions were answered to the patient's satisfaction.     Irine Seal

## 2021-01-11 ENCOUNTER — Encounter (HOSPITAL_BASED_OUTPATIENT_CLINIC_OR_DEPARTMENT_OTHER): Payer: Self-pay | Admitting: Urology

## 2022-05-13 DIAGNOSIS — E1165 Type 2 diabetes mellitus with hyperglycemia: Secondary | ICD-10-CM | POA: Diagnosis not present

## 2022-05-13 DIAGNOSIS — J302 Other seasonal allergic rhinitis: Secondary | ICD-10-CM | POA: Diagnosis not present

## 2022-05-13 DIAGNOSIS — E7800 Pure hypercholesterolemia, unspecified: Secondary | ICD-10-CM | POA: Diagnosis not present

## 2022-05-13 DIAGNOSIS — D179 Benign lipomatous neoplasm, unspecified: Secondary | ICD-10-CM | POA: Diagnosis not present

## 2022-09-16 DIAGNOSIS — E1165 Type 2 diabetes mellitus with hyperglycemia: Secondary | ICD-10-CM | POA: Diagnosis not present

## 2022-09-16 DIAGNOSIS — Z125 Encounter for screening for malignant neoplasm of prostate: Secondary | ICD-10-CM | POA: Diagnosis not present

## 2022-09-16 DIAGNOSIS — E7800 Pure hypercholesterolemia, unspecified: Secondary | ICD-10-CM | POA: Diagnosis not present

## 2022-09-23 DIAGNOSIS — Z0000 Encounter for general adult medical examination without abnormal findings: Secondary | ICD-10-CM | POA: Diagnosis not present

## 2022-09-23 DIAGNOSIS — E1165 Type 2 diabetes mellitus with hyperglycemia: Secondary | ICD-10-CM | POA: Diagnosis not present

## 2022-09-23 DIAGNOSIS — Z1339 Encounter for screening examination for other mental health and behavioral disorders: Secondary | ICD-10-CM | POA: Diagnosis not present

## 2022-09-23 DIAGNOSIS — Z1331 Encounter for screening for depression: Secondary | ICD-10-CM | POA: Diagnosis not present

## 2022-10-04 IMAGING — US US SCROTUM W/ DOPPLER COMPLETE
1 series · 14 of 25 positions shown · non-contrast
Comparison: None.

CLINICAL DATA: Pain

EXAM:
SCROTAL ULTRASOUND
DOPPLER ULTRASOUND OF THE TESTICLES
TECHNIQUE: Complete ultrasound examination of the testicles, epididymis, and
other scrotal structures was performed. Color and spectral Doppler
ultrasound were also utilized to evaluate blood flow to the
testicles.

[Series 1: us scrotum w/doppler · 14 of 53 slices shown]
[im 1/53]
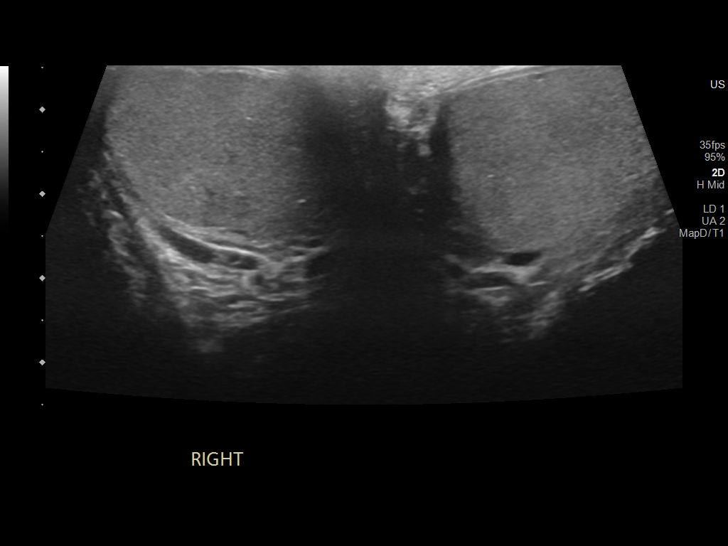
[im 5/53]
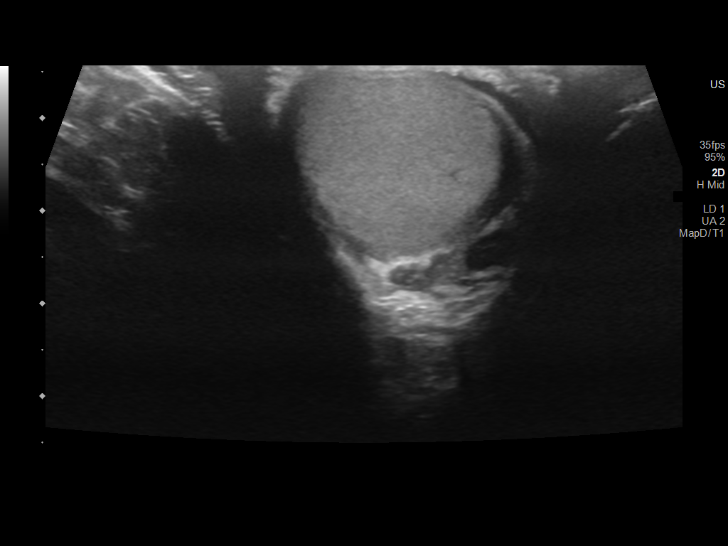
[im 9/53]
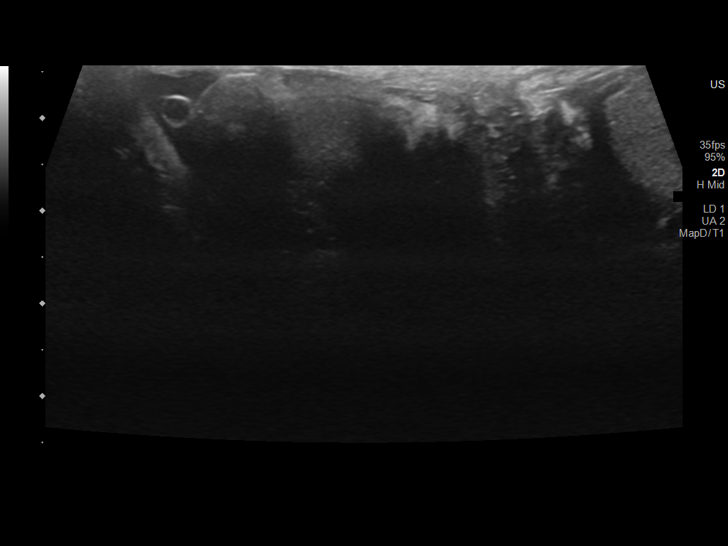
[im 14/53]
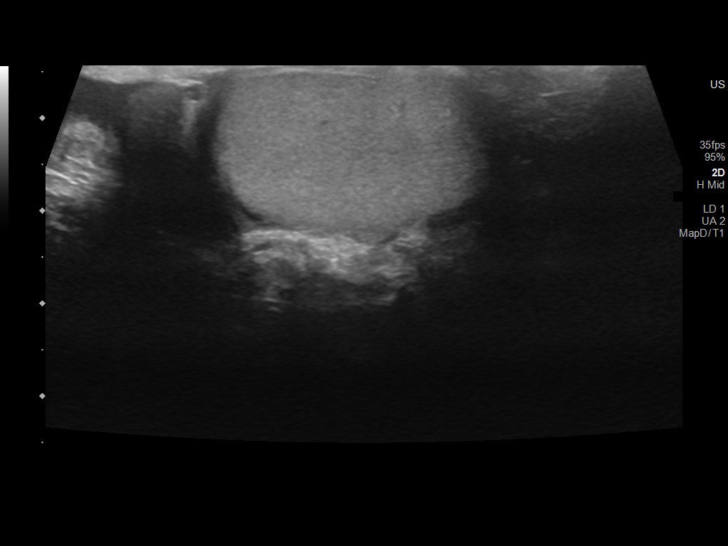
[im 18/53]
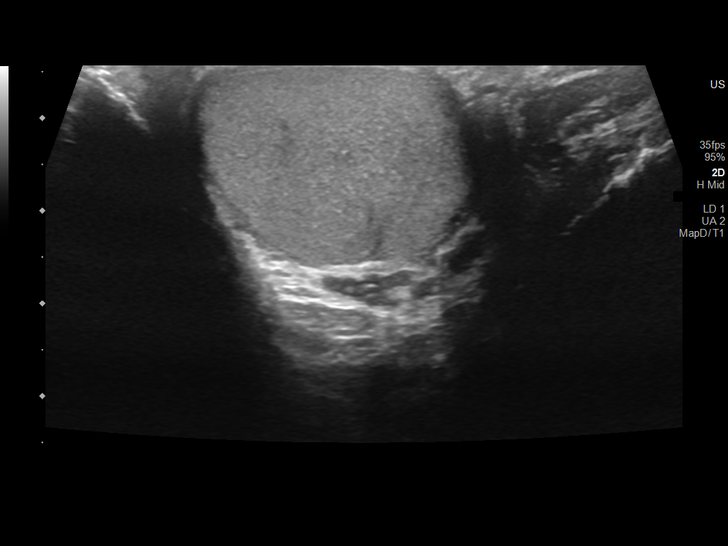
[im 20/53]
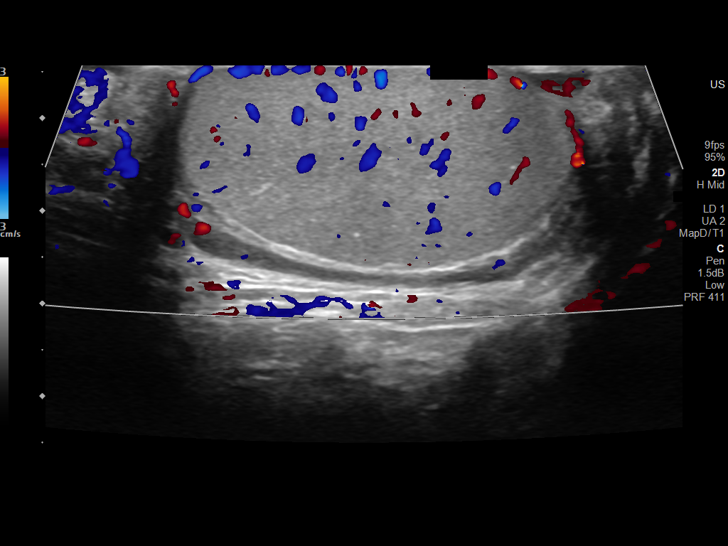
[im 24/53]
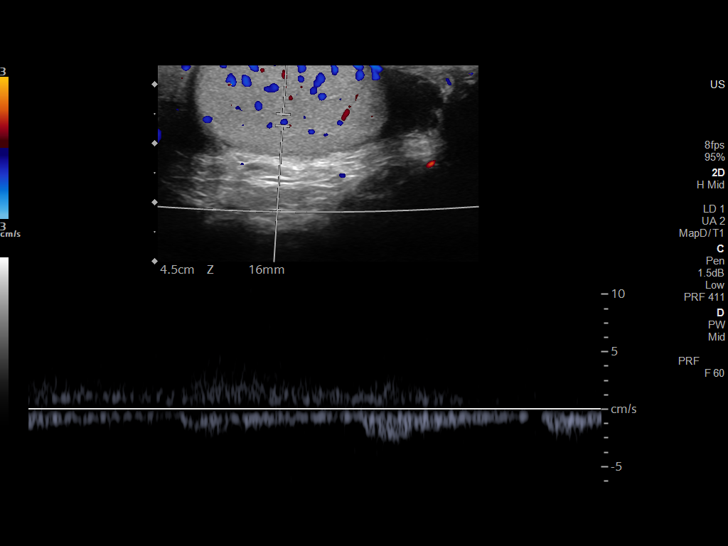
[im 29/53]
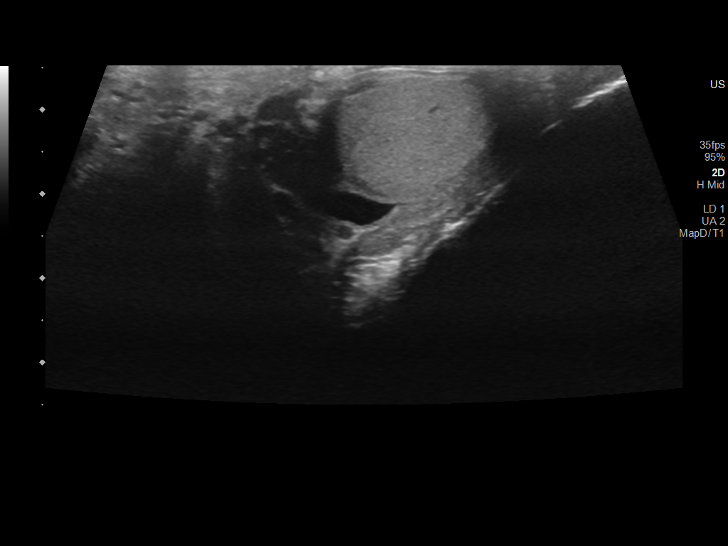
[im 33/53]
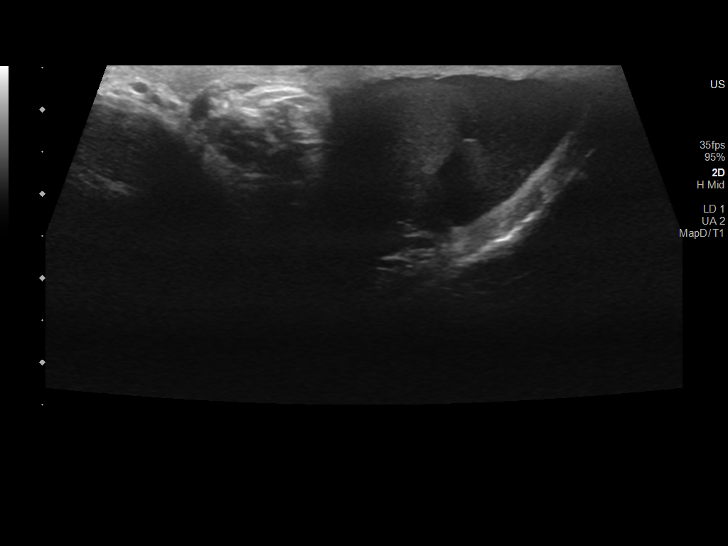
[im 35/53]
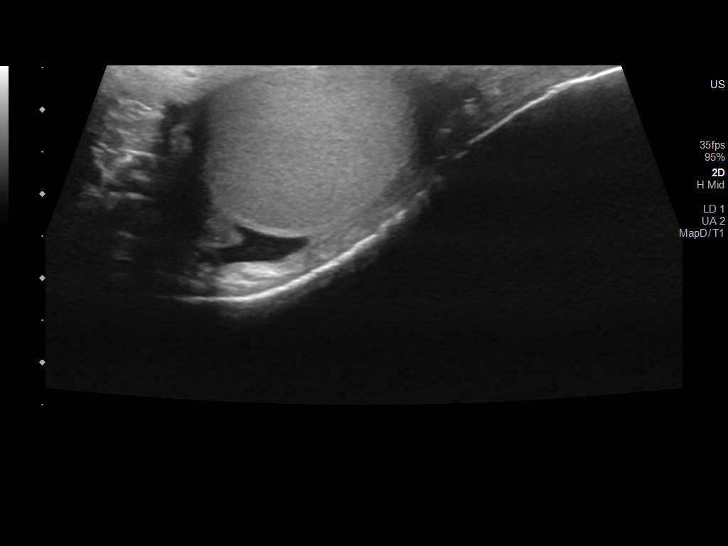
[im 40/53]
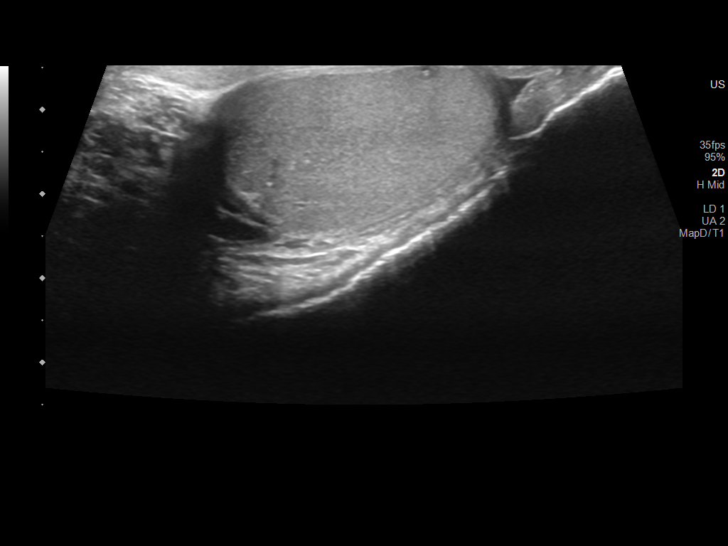
[im 44/53]
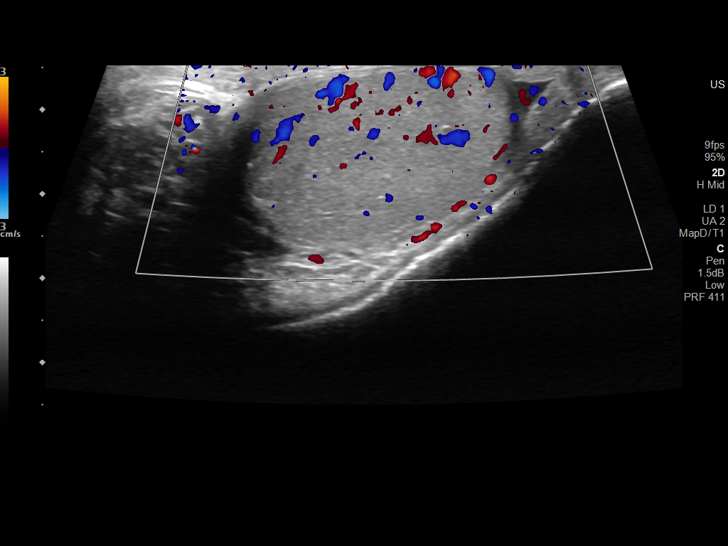
[im 48/53]
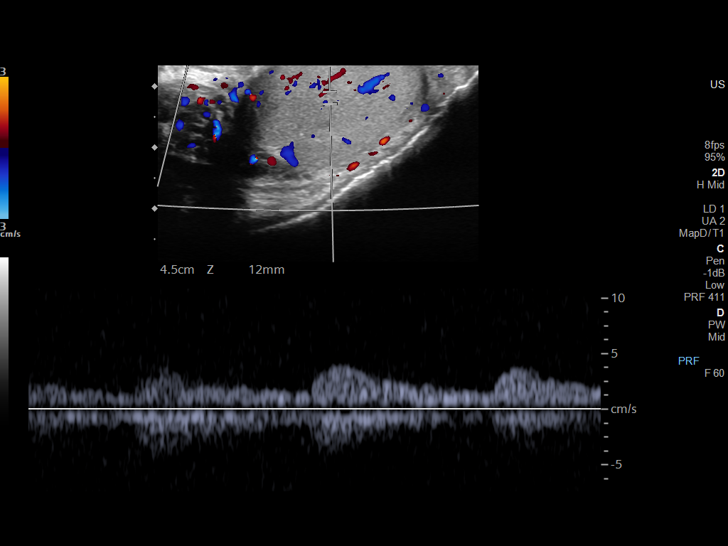
[im 53/53]
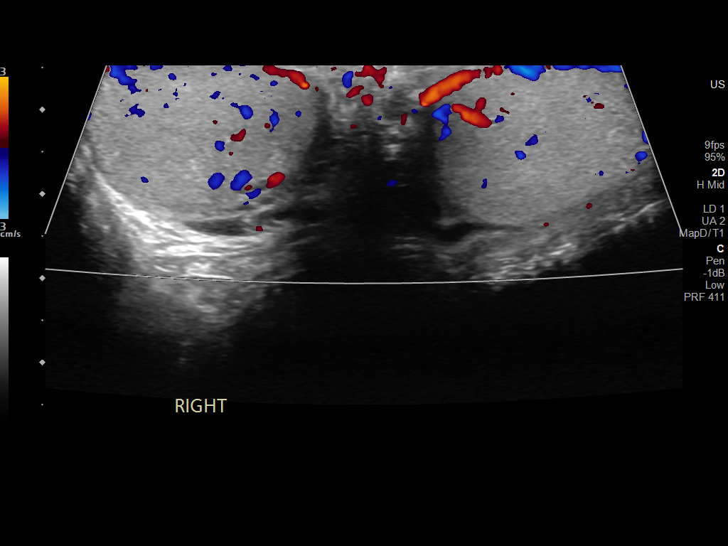

[14 of 25 positions shown; findings below may reference images not displayed]

FINDINGS: Right testicle

Measurements: 4.3 x 2.1 x 3 cm. No mass or microlithiasis
visualized.

Left testicle

Measurements: 3.5 x 1.9 x 3.1 cm. No mass or microlithiasis
visualized.

Right epididymis:  Normal in size and appearance.

Left epididymis:  Normal in size and appearance.

Hydrocele:  Small, bilateral

Varicocele:  None visualized.

Pulsed Doppler interrogation of both testes demonstrates normal low
resistance arterial and venous waveforms bilaterally.
IMPRESSION: 1. No evidence of testicular torsion or mass.
2. Small bilateral hydroceles

## 2023-05-12 DIAGNOSIS — E7800 Pure hypercholesterolemia, unspecified: Secondary | ICD-10-CM | POA: Diagnosis not present

## 2023-05-12 DIAGNOSIS — E1165 Type 2 diabetes mellitus with hyperglycemia: Secondary | ICD-10-CM | POA: Diagnosis not present

## 2023-07-21 DIAGNOSIS — E1165 Type 2 diabetes mellitus with hyperglycemia: Secondary | ICD-10-CM | POA: Diagnosis not present

## 2023-12-15 DIAGNOSIS — E1165 Type 2 diabetes mellitus with hyperglycemia: Secondary | ICD-10-CM | POA: Diagnosis not present

## 2023-12-15 DIAGNOSIS — Z1339 Encounter for screening examination for other mental health and behavioral disorders: Secondary | ICD-10-CM | POA: Diagnosis not present

## 2023-12-15 DIAGNOSIS — Z0000 Encounter for general adult medical examination without abnormal findings: Secondary | ICD-10-CM | POA: Diagnosis not present

## 2023-12-15 DIAGNOSIS — Z1331 Encounter for screening for depression: Secondary | ICD-10-CM | POA: Diagnosis not present

## 2024-03-19 ENCOUNTER — Encounter: Payer: Self-pay | Admitting: Gastroenterology

## 2024-04-19 ENCOUNTER — Ambulatory Visit: Admitting: Gastroenterology

## 2024-05-10 ENCOUNTER — Ambulatory Visit: Admitting: Gastroenterology
